# Patient Record
Sex: Male | Born: 1950 | Race: White | Hispanic: No | Marital: Married | Smoking: Never smoker
Health system: Southern US, Community
[De-identification: ages and names within clinical notes are randomized; demographics above are authoritative.]

## PROBLEM LIST (undated history)

## (undated) DIAGNOSIS — B182 Chronic viral hepatitis C: Secondary | ICD-10-CM

## (undated) DIAGNOSIS — Z860100 Personal history of colon polyps, unspecified: Secondary | ICD-10-CM

## (undated) DIAGNOSIS — G4700 Insomnia, unspecified: Secondary | ICD-10-CM

## (undated) DIAGNOSIS — T7840XA Allergy, unspecified, initial encounter: Secondary | ICD-10-CM

## (undated) DIAGNOSIS — M1990 Unspecified osteoarthritis, unspecified site: Secondary | ICD-10-CM

## (undated) DIAGNOSIS — I10 Essential (primary) hypertension: Secondary | ICD-10-CM

## (undated) DIAGNOSIS — D1803 Hemangioma of intra-abdominal structures: Secondary | ICD-10-CM

## (undated) DIAGNOSIS — F1010 Alcohol abuse, uncomplicated: Secondary | ICD-10-CM

## (undated) DIAGNOSIS — Z5189 Encounter for other specified aftercare: Secondary | ICD-10-CM

## (undated) DIAGNOSIS — E785 Hyperlipidemia, unspecified: Secondary | ICD-10-CM

## (undated) DIAGNOSIS — B1920 Unspecified viral hepatitis C without hepatic coma: Secondary | ICD-10-CM

## (undated) DIAGNOSIS — R079 Chest pain, unspecified: Secondary | ICD-10-CM

## (undated) DIAGNOSIS — F419 Anxiety disorder, unspecified: Secondary | ICD-10-CM

## (undated) HISTORY — DX: Essential (primary) hypertension: I10

## (undated) HISTORY — DX: Chest pain, unspecified: R07.9

## (undated) HISTORY — DX: Anxiety disorder, unspecified: F41.9

## (undated) HISTORY — DX: Encounter for other specified aftercare: Z51.89

## (undated) HISTORY — DX: Allergy, unspecified, initial encounter: T78.40XA

## (undated) HISTORY — DX: Alcohol abuse, uncomplicated: F10.10

## (undated) HISTORY — PX: NASAL SINUS SURGERY: SHX719

## (undated) HISTORY — DX: Hemangioma of intra-abdominal structures: D18.03

## (undated) HISTORY — DX: Hyperlipidemia, unspecified: E78.5

## (undated) HISTORY — DX: Personal history of colonic polyps: Z86.010

## (undated) HISTORY — DX: Personal history of colon polyps, unspecified: Z86.0100

## (undated) HISTORY — DX: Unspecified viral hepatitis C without hepatic coma: B19.20

## (undated) HISTORY — DX: Unspecified osteoarthritis, unspecified site: M19.90

## (undated) HISTORY — DX: Chronic viral hepatitis C: B18.2

## (undated) HISTORY — DX: Insomnia, unspecified: G47.00

---
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
## 1983-05-30 HISTORY — PX: MANDIBLE SURGERY: SHX707

## 1997-11-24 ENCOUNTER — Ambulatory Visit (HOSPITAL_COMMUNITY): Admission: RE | Admit: 1997-11-24 | Discharge: 1997-11-24 | Payer: Self-pay | Admitting: Gastroenterology

## 1998-05-29 HISTORY — PX: OTHER SURGICAL HISTORY: SHX169

## 1998-08-14 ENCOUNTER — Ambulatory Visit (HOSPITAL_COMMUNITY): Admission: RE | Admit: 1998-08-14 | Discharge: 1998-08-14 | Payer: Self-pay | Admitting: Internal Medicine

## 2000-02-17 ENCOUNTER — Ambulatory Visit (HOSPITAL_COMMUNITY): Admission: RE | Admit: 2000-02-17 | Discharge: 2000-02-17 | Payer: Self-pay | Admitting: Gastroenterology

## 2001-05-29 HISTORY — PX: UMBILICAL HERNIA REPAIR: SHX196

## 2001-08-14 ENCOUNTER — Encounter (INDEPENDENT_AMBULATORY_CARE_PROVIDER_SITE_OTHER): Payer: Self-pay | Admitting: Specialist

## 2001-08-14 ENCOUNTER — Ambulatory Visit (HOSPITAL_COMMUNITY): Admission: RE | Admit: 2001-08-14 | Discharge: 2001-08-14 | Payer: Self-pay | Admitting: General Surgery

## 2002-02-18 ENCOUNTER — Encounter (INDEPENDENT_AMBULATORY_CARE_PROVIDER_SITE_OTHER): Payer: Self-pay | Admitting: Specialist

## 2002-02-18 ENCOUNTER — Encounter: Payer: Self-pay | Admitting: Gastroenterology

## 2002-02-18 ENCOUNTER — Ambulatory Visit (HOSPITAL_COMMUNITY): Admission: RE | Admit: 2002-02-18 | Discharge: 2002-02-18 | Payer: Self-pay | Admitting: Gastroenterology

## 2004-08-01 ENCOUNTER — Ambulatory Visit (HOSPITAL_COMMUNITY): Admission: RE | Admit: 2004-08-01 | Discharge: 2004-08-01 | Payer: Self-pay | Admitting: Gastroenterology

## 2004-08-01 ENCOUNTER — Encounter (INDEPENDENT_AMBULATORY_CARE_PROVIDER_SITE_OTHER): Payer: Self-pay | Admitting: *Deleted

## 2005-06-19 ENCOUNTER — Encounter: Payer: Self-pay | Admitting: Gastroenterology

## 2005-06-28 ENCOUNTER — Observation Stay (HOSPITAL_COMMUNITY): Admission: EM | Admit: 2005-06-28 | Discharge: 2005-06-29 | Payer: Self-pay | Admitting: Emergency Medicine

## 2007-08-30 ENCOUNTER — Encounter: Admission: RE | Admit: 2007-08-30 | Discharge: 2007-08-30 | Payer: Self-pay | Admitting: Gastroenterology

## 2007-09-22 ENCOUNTER — Encounter: Admission: RE | Admit: 2007-09-22 | Discharge: 2007-09-22 | Payer: Self-pay | Admitting: Internal Medicine

## 2007-09-22 DIAGNOSIS — D1803 Hemangioma of intra-abdominal structures: Secondary | ICD-10-CM | POA: Insufficient documentation

## 2007-10-05 DIAGNOSIS — B182 Chronic viral hepatitis C: Secondary | ICD-10-CM | POA: Insufficient documentation

## 2007-10-05 DIAGNOSIS — F1010 Alcohol abuse, uncomplicated: Secondary | ICD-10-CM

## 2007-10-05 DIAGNOSIS — F1020 Alcohol dependence, uncomplicated: Secondary | ICD-10-CM | POA: Insufficient documentation

## 2007-10-05 DIAGNOSIS — G4700 Insomnia, unspecified: Secondary | ICD-10-CM

## 2007-10-05 DIAGNOSIS — Z860100 Personal history of colon polyps, unspecified: Secondary | ICD-10-CM | POA: Insufficient documentation

## 2007-10-08 ENCOUNTER — Ambulatory Visit: Payer: Self-pay | Admitting: Gastroenterology

## 2007-10-08 DIAGNOSIS — F411 Generalized anxiety disorder: Secondary | ICD-10-CM | POA: Insufficient documentation

## 2007-11-18 ENCOUNTER — Telehealth (INDEPENDENT_AMBULATORY_CARE_PROVIDER_SITE_OTHER): Payer: Self-pay | Admitting: *Deleted

## 2007-12-16 ENCOUNTER — Ambulatory Visit: Payer: Self-pay | Admitting: Gastroenterology

## 2007-12-16 LAB — CONVERTED CEMR LAB
AST: 38 units/L — ABNORMAL HIGH (ref 0–37)
Albumin: 4.3 g/dL (ref 3.5–5.2)
BUN: 12 mg/dL (ref 6–23)
Calcium: 9.2 mg/dL (ref 8.4–10.5)
Chloride: 103 meq/L (ref 96–112)
Eosinophils Relative: 3.5 % (ref 0.0–5.0)
GFR calc non Af Amer: 82 mL/min
Glucose, Bld: 93 mg/dL (ref 70–99)
INR: 1 (ref 0.8–1.0)
Monocytes Relative: 12.7 % — ABNORMAL HIGH (ref 3.0–12.0)
Neutrophils Relative %: 55.6 % (ref 43.0–77.0)
Platelets: 203 10*3/uL (ref 150–400)
Potassium: 4.6 meq/L (ref 3.5–5.1)
Prothrombin Time: 12.4 s (ref 10.9–13.3)
Total Protein: 7.6 g/dL (ref 6.0–8.3)
WBC: 4.4 10*3/uL — ABNORMAL LOW (ref 4.5–10.5)
aPTT: 31.9 s — ABNORMAL HIGH (ref 21.7–29.8)

## 2007-12-17 ENCOUNTER — Telehealth: Payer: Self-pay | Admitting: Gastroenterology

## 2007-12-24 ENCOUNTER — Telehealth: Payer: Self-pay | Admitting: Gastroenterology

## 2008-04-17 ENCOUNTER — Encounter: Payer: Self-pay | Admitting: Gastroenterology

## 2008-04-28 ENCOUNTER — Encounter: Admission: RE | Admit: 2008-04-28 | Discharge: 2008-04-28 | Payer: Self-pay | Admitting: Internal Medicine

## 2008-04-28 ENCOUNTER — Encounter: Payer: Self-pay | Admitting: Gastroenterology

## 2008-07-07 ENCOUNTER — Ambulatory Visit: Payer: Self-pay | Admitting: Gastroenterology

## 2008-07-08 LAB — CONVERTED CEMR LAB
AST: 37 units/L (ref 0–37)
Alkaline Phosphatase: 70 units/L (ref 39–117)
BUN: 12 mg/dL (ref 6–23)
Basophils Relative: 0.8 % (ref 0.0–3.0)
Creatinine, Ser: 1 mg/dL (ref 0.4–1.5)
Eosinophils Absolute: 0.2 10*3/uL (ref 0.0–0.7)
Eosinophils Relative: 3.9 % (ref 0.0–5.0)
GFR calc non Af Amer: 82 mL/min
Glucose, Bld: 98 mg/dL (ref 70–99)
HCT: 48.3 % (ref 39.0–52.0)
Hemoglobin: 16.8 g/dL (ref 13.0–17.0)
MCV: 91.4 fL (ref 78.0–100.0)
Monocytes Absolute: 0.6 10*3/uL (ref 0.1–1.0)
Neutro Abs: 3.3 10*3/uL (ref 1.4–7.7)
Platelets: 217 10*3/uL (ref 150–400)
Potassium: 4.4 meq/L (ref 3.5–5.1)
Total Bilirubin: 1 mg/dL (ref 0.3–1.2)
WBC: 5.8 10*3/uL (ref 4.5–10.5)

## 2008-07-14 ENCOUNTER — Telehealth: Payer: Self-pay | Admitting: Gastroenterology

## 2008-08-10 ENCOUNTER — Telehealth (INDEPENDENT_AMBULATORY_CARE_PROVIDER_SITE_OTHER): Payer: Self-pay | Admitting: *Deleted

## 2008-08-12 ENCOUNTER — Ambulatory Visit: Payer: Self-pay | Admitting: Gastroenterology

## 2008-08-20 LAB — CONVERTED CEMR LAB
HCV Ab: REACTIVE — AB
HCV Quantitative: 2070000 intl units/mL — ABNORMAL HIGH (ref <43)

## 2008-09-02 ENCOUNTER — Telehealth (INDEPENDENT_AMBULATORY_CARE_PROVIDER_SITE_OTHER): Payer: Self-pay | Admitting: *Deleted

## 2008-09-17 ENCOUNTER — Ambulatory Visit: Payer: Self-pay | Admitting: Gastroenterology

## 2008-09-30 ENCOUNTER — Telehealth (INDEPENDENT_AMBULATORY_CARE_PROVIDER_SITE_OTHER): Payer: Self-pay | Admitting: *Deleted

## 2008-10-08 ENCOUNTER — Encounter: Payer: Self-pay | Admitting: Gastroenterology

## 2008-10-08 ENCOUNTER — Ambulatory Visit: Payer: Self-pay | Admitting: Gastroenterology

## 2008-10-12 ENCOUNTER — Ambulatory Visit: Payer: Self-pay | Admitting: Gastroenterology

## 2008-10-27 ENCOUNTER — Encounter (INDEPENDENT_AMBULATORY_CARE_PROVIDER_SITE_OTHER): Payer: Self-pay | Admitting: Diagnostic Radiology

## 2008-10-27 ENCOUNTER — Ambulatory Visit (HOSPITAL_COMMUNITY): Admission: RE | Admit: 2008-10-27 | Discharge: 2008-10-27 | Payer: Self-pay | Admitting: Gastroenterology

## 2008-10-30 ENCOUNTER — Encounter: Payer: Self-pay | Admitting: Gastroenterology

## 2008-10-30 ENCOUNTER — Ambulatory Visit: Payer: Self-pay | Admitting: Gastroenterology

## 2008-11-04 ENCOUNTER — Encounter: Payer: Self-pay | Admitting: Gastroenterology

## 2008-12-03 ENCOUNTER — Encounter: Payer: Self-pay | Admitting: Gastroenterology

## 2008-12-03 ENCOUNTER — Ambulatory Visit: Payer: Self-pay | Admitting: Gastroenterology

## 2009-11-26 ENCOUNTER — Ambulatory Visit: Payer: Self-pay | Admitting: Gastroenterology

## 2009-11-30 LAB — CONVERTED CEMR LAB
ALT: 46 units/L (ref 0–53)
AST: 37 units/L (ref 0–37)
Basophils Relative: 0.5 % (ref 0.0–3.0)
CO2: 31 meq/L (ref 19–32)
Calcium: 9.4 mg/dL (ref 8.4–10.5)
Chloride: 103 meq/L (ref 96–112)
Creatinine, Ser: 1.1 mg/dL (ref 0.4–1.5)
Eosinophils Relative: 3.8 % (ref 0.0–5.0)
GFR calc non Af Amer: 70.59 mL/min (ref >60)
INR: 1 (ref 0.8–1.0)
Lymphocytes Relative: 25.1 % (ref 12.0–46.0)
Monocytes Absolute: 0.6 10*3/uL (ref 0.1–1.0)
Neutrophils Relative %: 60.2 % (ref 43.0–77.0)
Platelets: 223 10*3/uL (ref 150.0–400.0)
Prothrombin Time: 10.8 s (ref 9.7–11.8)
RBC: 5.27 M/uL (ref 4.22–5.81)
Sodium: 142 meq/L (ref 135–145)
Total Bilirubin: 0.5 mg/dL (ref 0.3–1.2)
Total Protein: 7.8 g/dL (ref 6.0–8.3)
WBC: 6.1 10*3/uL (ref 4.5–10.5)

## 2009-12-03 ENCOUNTER — Ambulatory Visit (HOSPITAL_COMMUNITY): Admission: RE | Admit: 2009-12-03 | Discharge: 2009-12-03 | Payer: Self-pay | Admitting: Gastroenterology

## 2010-01-10 ENCOUNTER — Encounter: Payer: Self-pay | Admitting: Family Medicine

## 2010-05-02 ENCOUNTER — Encounter: Payer: Self-pay | Admitting: Gastroenterology

## 2010-06-28 NOTE — Assessment & Plan Note (Signed)
GI problem list: 1. Chronic hepatitis C. Liver biopsy 2006 showed minimally active disease (grade 1, stage 0). No signs of cirrhosis.  MRI, April 2009 showed small lesion liver suspicious for benign hemangioma.  Evaluation at [facility] 2010 hepatitis clinic including repeat liver biopsy, imaging studies suggested that he has no sign of fibrosis in liver and they did not feel strongly about recommending hepatitis see eradication therapy. 2. Personal history of tubular adenomas (3 polyps removed by Dr. Kinnie Scales. early 2007; complicated by post polypectomy  bleeding...Dr. Kinnie Scales did not see him in hosp after the complication, so he changed care.)  Colonoscopy June 2010 by Dr. Christella Hartigan found one 7 mm polyp that was hyperplastic. Recall colonoscopy June 2015.   History of Present Illness Visit Type: Follow-up Visit Primary GI MD: Rob Bunting MD Primary Provider: Juline Patch, MD Chief Complaint: yearly follow-up History of Present Illness:      very pleasant 60 year old man whom I last saw about one year ago at the time of a colonoscopy. See those results are summarized above.  Since then he has done well, feels well.  No jaundice, no overt GI bleeding, no edema.  Still drinks 2 beers on weekdays, 3 beers on weekends.  He will be seeing [facility] Hepatitis clinic soon (has tried.)           Current Medications (verified): 1)  Flonase 50 Mcg/act  Susp (Fluticasone Propionate) .... Prn 2)  Zyrtec Allergy 10 Mg  Tabs (Cetirizine Hcl) .... Once Daily 3)  Aleve 220 Mg  Caps (Naproxen Sodium) .... Prn 4)  Vitamin D 1000 Unit Tabs (Cholecalciferol) .Marland Kitchen.. 1 By Mouth Once Daily 5)  Metamucil 30.9 % Powd (Psyllium) .Marland KitchenMarland KitchenMarland Kitchen 17 Grams in Glass of Water Once Daily  Allergies (verified): 1)  ! Penicillin  Vital Signs:  Patient profile:   60 year old male Height:      69 inches Weight:      164 pounds BMI:     24.31 Pulse rate:   80 / minute BP sitting:   164 / 90  (left arm)  Vitals Entered By: Kevin Vega NCMA (November 26, 2009 2:09 PM)  Physical Exam  Additional Exam:  Constitutional: generally well appearing Psychiatric: alert and oriented times 3 Abdomen: soft, non-tender, non-distended, normal bowel sounds; no obvious ascites No lower extremity edema    Impression & Recommendations:  Problem # 1:  chronic hepatitis C he has no physical or history signs of cirrhosis. He will get a basic set of repeat labs including a CBC, complete metabolic profile, coags as well as ultrasound. He does still drink alcohol and I recommended again that he quit however he has her this recommendation countless times and has not yet stopped.  We will work to get him back in to the hepatitis medical specialties clinic at [facility]. They wanted to see him back at one year from the last visit which was July, 2010.  Other Orders: Ultrasound Abdomen (UAS) TLB-CBC Platelet - w/Differential (85025-CBCD) TLB-PT (Protime) (85610-PTP) TLB-CMP (Comprehensive Metabolic Pnl) (80053-COMP)  Patient Instructions: 1)  You will get lab test(s) done today (cbc, cmet, inr). 2)  You will be scheduled for an ultrasound. 3)  We will work to get you back in for follow up at Hep C, specialties clinic. 4)  A copy of this information will be sent to Dr. Ricki Miller. 5)  The medication list was reviewed and reconciled.  All changed / newly prescribed medications were explained.  A complete medication  list was provided to the patient / caregiver.  Appended Document: Orders Update    Clinical Lists Changes  Orders: Added new Test order of [facility]) - Signed

## 2010-09-05 LAB — CBC
HCT: 45.8 % (ref 39.0–52.0)
Hemoglobin: 16.1 g/dL (ref 13.0–17.0)
MCHC: 35.2 g/dL (ref 30.0–36.0)
Platelets: 187 10*3/uL (ref 150–400)
RDW: 12.6 % (ref 11.5–15.5)

## 2010-10-14 NOTE — Discharge Summary (Signed)
NAME:  Kevin Vega, Kevin Vega NO.:  [id number]   MEDICAL RECORD NO.:  [id number]          PATIENT TYPE:  INP   LOCATION:  5708                         FACILITY:  [facility]   PHYSICIAN:  Elliot Cousin, M.D.    DATE OF BIRTH:  January 01, 1951   DATE OF ADMISSION:  06/28/2005  DATE OF DISCHARGE:  06/29/2005                                 DISCHARGE SUMMARY   DISCHARGE DIAGNOSES:  1.  Hematochezia/bright red blood per rectum.  2.  Status post colonoscopy and polypectomy x3 on January22,2007, per Dr.      Kinnie Scales.  3.  History of hepatitis C without cirrhosis, per the patient, last biopsy      and April2006.  4.  Status post umbilical hernia repair in the past.  5.  History of rhinitis.   MEDICATIONS:  1.  Senokot-S one tablet daily as needed.  2.  Flonase one spray in each nostril as previously ordered.  3.  Multivitamin with iron.   DISCHARGE DISPOSITION:  The patient was discharged to home in improved and  stable condition on February1,2007.   1.  The patient was advised to follow up with:      1.  Dr. Kinnie Scales in one to two weeks.      2.  With his primary care physician, Dr. Lendell Caprice, in two to four weeks.  2.  He was advised to return to the hospital if gross GI bleeding were to      reoccur.   HISTORY OF PRESENT ILLNESS:  The patient is a 60 year old man who recently  underwent a colonoscopy and polypectomy, on January22,2007, who presented to  the emergency department, on January31,2007, complaining of bright red blood  per rectum.  He had three episodes of bleeding from his rectum prior to his  presentation to the emergency department.  He had taken Aleve the day prior  for cold symptoms and a low grade temperature.  He denied any abdominal  pain, nausea, or vomiting.  He felt a little lightheaded earlier, prior to  hospital admission, however, it resolved at the time of evaluation in the  emergency department.  The patient was admitted for observation.   HOSPITAL  COURSE:  BRIGHT RED BLOOD PER RECTUM/HEMATOCHEZIA/STATUS POST  POLYPECTOMY AND NSAID USE.  The patient's hemoglobin on admission was 14.5  with a hematocrit of 41.6.  His INR was 1.1, PT 14.3, and PTT was 28.  His  hemoglobin and hematocrit were monitored every eight hours.  He was started  on with IV fluids with normal saline at 75 cc/hr.  His hemoglobin did fall  to 13.0, however, it stabilized at 13.7 prior to hospital discharge.  Prior  to hospital discharge, the patient was evaluated by Dr. Kinnie Scales.  Dr. Kinnie Scales  felt that the patient's hematochezia was secondary to status post  polypectomy and recent NSAID use. The patient was completely stable and in  no acute distress at the time of hospital discharge.  He had no further  bleeding prior to  hospital discharge.  The patient was strongly advised to discontinue all  NSAIDs for several weeks.  He was advised to treat pain or pain like  symptoms with Tylenol.  However given his history of hepatitis C and alcohol  use, the patient was advised to not take Tylenol liberally.  Further  followup by Dr. Kinnie Scales .      Elliot Cousin, M.D.  Electronically Signed     DF/MEDQ  D:  07/01/2005  T:  07/01/2005  Job:  [id number]   cc:   Janae Bridgeman. Eloise Harman., M.D.  Fax: [phone]   Griffith Citron, M.D.  Fax: [phone]

## 2010-10-14 NOTE — Procedures (Signed)
[facility]  Patient:    Kevin Vega, Kevin Vega                     MRN: [id number] Proc. Date: 02/17/00 Adm. Date:  [id number] Disc. Date: [id number] Attending:  Deneen Harts CC:         Janae Bridgeman. Eloise Harman., M.D.   Procedure Report  PROCEDURE:  Colonoscopy.  INDICATION:  A 60 year old white male undergoing colonoscopy for neoplasia surveillance.  Family history is significant for father with colon polyps. The patient is symptomatic with hematochezia.  No prior colorectal neoplasia surveillance.  DESCRIPTION OF PROCEDURE:  After reviewing the nature of the procedure with the patient, including potential risks and complications and after discussing alternative methods of diagnosis and treatment, informed consent was signed.  The patient was premedicated, receiving IV sedation totalling Versed 9 mg, fentanyl 100 mcg administered IV in divided doses prior to and during the course of the procedure.  Using an Olympus pediatric PCF-140L video endoscope, rectum was intubated after normal digital examination.  There was no evidence of perianal or intrarectal pathology.  Prostate was normal in size, symmetric, and smooth.  The scope was easily advanced around the entire length of the colon to the cecum, identified by the appendiceal orifice and the ileocecal valve. Preparation was excellent throughout.  The scope was slowly withdrawn with careful inspection of the entire colon in a retrograde manner, including retroflexed view in the rectal vault.  The terminal ileum was also inspected over its distal 5 cm, which appeared entirely normal.  The colon was decompressed, scope withdrawn.  The patient tolerated the procedure without difficulty, being maintained on Datascope monitor and low-flow oxygen throughout.  Time 1, technical 1, preparation 1, total score equal to 3.  ASSESSMENT: 1. Normal colonoscopy. 2. Normal ileoscopy. 3. No evidence of  colorectal neoplasia. 4. Hematochezia probably secondary to intermittent anorectal fissure.  RECOMMENDATION: 1. Annual Hemoccult. 2. Colonoscopy five years because of family history. 3. Rectal care p.r.n. DD:  02/17/00 TD:  02/20/00 Job: [id number]

## 2010-10-14 NOTE — H&P (Signed)
NAME:  Kevin Vega, Kevin Vega NO.:  [id number]   MEDICAL RECORD NO.:  [id number]          PATIENT TYPE:  EMS   LOCATION:  MAJO                         FACILITY:  [facility]   PHYSICIAN:  Nelma Rothman, MD   DATE OF BIRTH:  [birth year]   DATE OF ADMISSION:  06/28/2005  DATE OF DISCHARGE:                                HISTORY & PHYSICAL   PRIMARY CARE PHYSICIAN:  Dr. Lendell Caprice.   PRIMARY GI DOCTOR:  Griffith Citron, M.D.   CHIEF COMPLAINT:  Bright red blood per rectum.   HISTORY OF PRESENT ILLNESS:  The patient is a 60 year old male who is  recently status post colonoscopy and polypectomy x3 on June 19, 2005,  complaining of the onset of bright red blood per rectum and hematochezia  today x3 episodes, the last of which had only minimal blood.  He states he  took an Aleve yesterday for cold symptoms and a low-grade temperature.  He  denies any abdominal pain.  He denies any nausea and vomiting.  He was  feeling a little lightheaded earlier today, but this has now resolved.  He  is otherwise feeling well.   PAST MEDICAL HISTORY:  1.  Hepatitis C without cirrhosis per the patient, last biopsy in April      2006.  2.  Status post umbilical hernia repair.   ALLERGIES:  PENICILLIN.   MEDICATIONS:  None.   SOCIAL HISTORY:  He lives in [place], [place], with his wife and 2  daughters.  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  He denies any history  of tobacco and drinks 2 drinks of alcohol per night.   FAMILY HISTORY:  His father has Parkinson's disease.  His mother is alive  and well.   REVIEW OF SYSTEMS:  Positive for cold symptoms and congestion for the past  few days as well as a low-grade fever, but he otherwise is feeling fine.  A  10-point Review of Systems is otherwise negative.   PHYSICAL EXAMINATION:  VITAL SIGNS: Temperature 98.8, blood pressure 128/77,  pulse 81, respiratory rate 20, with oxygen saturation 100% on room air.  GENERAL:  He is  in no acute distress.  HEENT:  Mucous membranes were moist.  There was no exudate.  NECK:  Supple.  There is no jugular venous distention.  HEART: Regular rate and rhythm with no murmurs, rubs, or gallops.  LUNGS:  Clear to auscultation bilaterally.  ABDOMEN:  Soft, nontender, nondistended, with normoactive bowel sounds.  EXTREMITIES:  No edema.  NEUROLOGIC:  Exam is grossly nonfocal.   LABORATORY DATA:  White blood cell count 6.7, hemoglobin 14.5, hematocrit  41.6, platelet count 204.  INR 1.1,  PTT 28.   ASSESSMENT AND PLAN:  1.  Bright red blood per rectum and hematochezia.  His hemoglobin and      hematocrit are reasonable.  He is currently hemodynamically stable.  The      patient will be n.p.o. for tonight.  Dr. Kinnie Scales is aware and does not      plan to repeat the colonoscopy for now unless the  bleeding is persistent      or severe.  He is available for consult.  We will follow q.8 h.      hemoglobin and hematocrit while he is here.  2.  History of hepatitis C.  I have encouraged complete alcohol abstinence.  3.  Disposition. If his hemoglobin and hematocrit are stable and there is no      further bleeding, I anticipate he may be able to be discharged in the      morning with followup with Dr. Kinnie Scales as planned previously.      Nelma Rothman, MD  Electronically Signed     RAR/MEDQ  D:  06/28/2005  T:  06/28/2005  Job:  [id number]

## 2010-10-14 NOTE — Op Note (Signed)
[facility]  Patient:    Kevin Vega, Kevin Vega Visit Number: [id number] MRN: [id number]          Service Type: DSU Location: DAY Attending Physician:  Carson Myrtle Dictated by:   Sheppard Plumber Earlene Plater, M.D. Proc. Date: 08/14/01 Admit Date:  08/14/2001                             Operative Report  PREOPERATIVE DIAGNOSES: 1. Umbilical hernia, incarcerated. 2. Multiple lipomata.  PROCEDURE: 1. Repair of hernia with mesh. 2. Excision of lipomas..  SURGEON:  Timothy E. Earlene Plater, M.D.  ASSISTANT:  Mr. Gilman Buttner.  ANESTHESIA:  General.  DESCRIPTION OF PROCEDURE:  Mr. Nuon is well known to me. He is otherwise healthy. He has an incarcerated umbilical hernia and multiple lipomas. He wishes them all to be repaired and/or removed today and we have agreed. He was evaluated by anesthesia, taken to the operating room, placed supine and general LMA anesthesia provided. The skin of the periumbilical area and the lipoma of the right lower quadrant abdominal wall were shaved and prepped in on e field and draped off as a sterile field. Prior to all incisions, 0.25% Marcaine with epinephrine wass used. A short horizontal incision wa made over the umbilical hernia beneath the umbilicus. The hernia was prominent in the wound. It was dissected from the surrounding subcutaneous tissue down to the fascial edge. The fascia had to be opened in order to reduce the hernia. This was mostly preperitoneal fat but there was a peritoneal sac. This was sharply removed with the cautery, bleeding was controlled. The peritoneal opening was closed with a 3-0 Vicryl suture. The wound was dry. A round plug of mesh was placed in the defect and the fascia was closed over this with interrupted buried 0 Prolene sutures. The wound was closed in layers with 3-0 Monocryl.  The lipoma in the right lower quadrant abdominal wall was removed by incision and blunt dissection. Bleeding was  controlled and the wound was closed with 3-0 Monocryl. Two lipomatas of the left upper arm were separately prepped, draped, and removed in the same fashion by injecting local, a small incision, blunt removal, control of bleeding, and closure with 3-0 Monocryl. The more distal lipoma was 2.5 cm. The upper lipoma was 3 cm. The abdominal wall lipoma was 3.5 cm. All wounds were dressed with Steri-Strips and dry sterile dressing. He tolerated it well and was removed to the recovery room in good condition. Written and verbal instructions were given to the patient and he will be seen and followed as an outpatient. Dictated by:   Sheppard Plumber Earlene Plater, M.D. Attending Physician:  Carson Myrtle DD:  08/14/01 TD:  08/14/01 Job: [id number]

## 2010-12-28 ENCOUNTER — Encounter: Payer: Self-pay | Admitting: Gastroenterology

## 2010-12-28 ENCOUNTER — Ambulatory Visit (INDEPENDENT_AMBULATORY_CARE_PROVIDER_SITE_OTHER): Payer: 59 | Admitting: Gastroenterology

## 2010-12-28 ENCOUNTER — Other Ambulatory Visit (INDEPENDENT_AMBULATORY_CARE_PROVIDER_SITE_OTHER): Payer: 59

## 2010-12-28 DIAGNOSIS — K739 Chronic hepatitis, unspecified: Secondary | ICD-10-CM

## 2010-12-28 LAB — CBC WITH DIFFERENTIAL/PLATELET
Basophils Absolute: 0 10*3/uL (ref 0.0–0.1)
Eosinophils Absolute: 0.2 10*3/uL (ref 0.0–0.7)
HCT: 48.6 % (ref 39.0–52.0)
Lymphs Abs: 1.2 10*3/uL (ref 0.7–4.0)
MCV: 90.8 fl (ref 78.0–100.0)
Monocytes Absolute: 0.5 10*3/uL (ref 0.1–1.0)
Neutrophils Relative %: 58.9 % (ref 43.0–77.0)
Platelets: 211 10*3/uL (ref 150.0–400.0)
RDW: 13 % (ref 11.5–14.6)

## 2010-12-28 LAB — PROTIME-INR: Prothrombin Time: 11.3 s (ref 10.2–12.4)

## 2010-12-28 NOTE — Patient Instructions (Addendum)
You will have labs checked today in the basement lab.  Please head down after you check out with the front desk  (cbc, cmet, inr) You will be set up for an ultrasound for chronic hepatitis C, ? Cirrhosis.  [facility] Please arrive on 12/30/10  845 am nothing to eat or drink after midnight. A copy of this information will be made available to Dr. Ricki Miller. Return to see Dr. Christella Hartigan in 12 months, sooner if needed. Alcohol consumption is bad in combination with Hep C.

## 2010-12-28 NOTE — Progress Notes (Signed)
GI problem list:  1. Chronic hepatitis C. Liver biopsy 2006 showed minimally active disease (grade 1, stage 0). No signs of cirrhosis. MRI, April 2009 showed small lesion liver suspicious for benign hemangioma. Evaluation at [facility] 2010 hepatitis clinic including repeat liver biopsy, imaging studies suggested that he has no sign of fibrosis in liver and they did not feel strongly about recommending hepatitis see eradication therapy.   Labs in July 2011 showed normal CBC, normal coags, normal complete metabolic profile. 2. Personal history of tubular adenomas (3 polyps removed by Dr. Kinnie Scales. early 2007; complicated by post polypectomy bleeding...Dr. Kinnie Scales did not see him in hosp after the complication, so he changed care.) Colonoscopy June 2010 by Dr. Christella Hartigan found one 7 mm polyp that was hyperplastic. Recall colonoscopy June 2015.   HPI: This is a very pleasant 60 year old man with chronic hepatitis C without signs of cirrhosis to date.  Yearlong sinus troubles.  Feels well otherwise.  2 a day beers and every once in a while 3.  No jaundice, no abd pains. No edema.  No overt GI bleeding.  He has called [facility] liver clinic, never heard back from them.      Past Medical History:   Hemangioma of intra-abdominal structures                     Personal history of colonic polyps                           Insomnia, unspecified                                        Alcohol abuse, unspecified                                   Chronic hepatitis C without mention of hepatic*              Anxiety                                                      Hypertension                                                Past Surgical History:   HERNIA REPAIR                                                reports that he has never smoked. He has never used smokeless tobacco. He reports that he drinks alcohol. He reports that he does not use illicit drugs.  family history includes Colon cancer in his  father.    Current medicines and allergies were reviewed in [place] [facility]    Physical Exam: BP 142/76  Pulse 64  Ht 5\' 9"  (1.753 m)  Wt 160 lb (72.576 kg)  BMI 23.63 kg/m2 Constitutional: generally well-appearing Psychiatric: alert and  oriented x3 Abdomen: soft, nontender, nondistended, no obvious ascites, no peritoneal signs, normal bowel sounds No lower extremity edema Anicteric sclera    Assessment and plan: 60 y.o. male with chronic hepatitis C  He does not have clinical signs of cirrhosis. I warned him again about alcohol consumption and told him that even small amounts could be quite dangerous in the setting of chronic hepatitis C. He'll get a basic set of labs including CBC, complete metabolic profile, coags and he will also get an abdominal ultrasound. You return to see me in one year and sooner if needed.

## 2010-12-29 LAB — COMPREHENSIVE METABOLIC PANEL
ALT: 35 U/L (ref 0–53)
AST: 32 U/L (ref 0–37)
Alkaline Phosphatase: 69 U/L (ref 39–117)
Glucose, Bld: 100 mg/dL — ABNORMAL HIGH (ref 70–99)
Potassium: 4.8 mEq/L (ref 3.5–5.1)
Sodium: 137 mEq/L (ref 135–145)
Total Bilirubin: 0.8 mg/dL (ref 0.3–1.2)
Total Protein: 8.1 g/dL (ref 6.0–8.3)

## 2010-12-30 ENCOUNTER — Ambulatory Visit (HOSPITAL_COMMUNITY)
Admission: RE | Admit: 2010-12-30 | Discharge: 2010-12-30 | Disposition: A | Discharge status: Home / Self Care | Payer: 59 | Source: Ambulatory Visit | Attending: Gastroenterology | Admitting: Gastroenterology

## 2010-12-30 DIAGNOSIS — K739 Chronic hepatitis, unspecified: Secondary | ICD-10-CM | POA: Insufficient documentation

## 2012-01-02 ENCOUNTER — Other Ambulatory Visit (INDEPENDENT_AMBULATORY_CARE_PROVIDER_SITE_OTHER): Payer: 59

## 2012-01-02 ENCOUNTER — Encounter: Payer: Self-pay | Admitting: Gastroenterology

## 2012-01-02 ENCOUNTER — Ambulatory Visit (INDEPENDENT_AMBULATORY_CARE_PROVIDER_SITE_OTHER): Payer: 59 | Admitting: Gastroenterology

## 2012-01-02 ENCOUNTER — Ambulatory Visit (HOSPITAL_COMMUNITY)
Admission: RE | Admit: 2012-01-02 | Discharge: 2012-01-02 | Disposition: A | Discharge status: Home / Self Care | Payer: 59 | Source: Ambulatory Visit | Attending: Gastroenterology | Admitting: Gastroenterology

## 2012-01-02 VITALS — BP 112/80 | HR 77 | Ht 69.0 in | Wt 163.0 lb

## 2012-01-02 DIAGNOSIS — B1920 Unspecified viral hepatitis C without hepatic coma: Secondary | ICD-10-CM | POA: Insufficient documentation

## 2012-01-02 DIAGNOSIS — B182 Chronic viral hepatitis C: Secondary | ICD-10-CM

## 2012-01-02 LAB — CBC WITH DIFFERENTIAL/PLATELET
Basophils Relative: 0.9 % (ref 0.0–3.0)
Eosinophils Absolute: 0.2 10*3/uL (ref 0.0–0.7)
HCT: 48.3 % (ref 39.0–52.0)
Hemoglobin: 16.7 g/dL (ref 13.0–17.0)
Lymphocytes Relative: 26 % (ref 12.0–46.0)
Lymphs Abs: 1.2 10*3/uL (ref 0.7–4.0)
MCHC: 34.6 g/dL (ref 30.0–36.0)
Neutro Abs: 2.7 10*3/uL (ref 1.4–7.7)
RBC: 5.35 Mil/uL (ref 4.22–5.81)

## 2012-01-02 LAB — COMPREHENSIVE METABOLIC PANEL
AST: 37 U/L (ref 0–37)
BUN: 14 mg/dL (ref 6–23)
CO2: 28 mEq/L (ref 19–32)
Calcium: 9.1 mg/dL (ref 8.4–10.5)
Chloride: 102 mEq/L (ref 96–112)
Creatinine, Ser: 1 mg/dL (ref 0.4–1.5)
GFR: 79.79 mL/min (ref >60.00)
Total Bilirubin: 0.9 mg/dL (ref 0.3–1.2)

## 2012-01-02 LAB — PROTIME-INR: INR: 1 ratio (ref 0.8–1.0)

## 2012-01-02 NOTE — Patient Instructions (Addendum)
You will have labs checked today in the basement lab.  Please head down after you check out with the front desk  (cbc, cmet, inr). You will be set up for an ultrasound of liver for chronic hepatitis C.  [facility] TODAY at 2:15 pm Nothing to eat or drink until after the Ultrasound Return to see Dr. Christella Hartigan in 12 months.

## 2012-01-02 NOTE — Progress Notes (Signed)
Review of pertinent gastrointestinal problems: 1. Chronic hepatitis C. Liver biopsy 2006 showed minimally active disease (grade 1, stage 0). No signs of cirrhosis. MRI, April 2009 showed small lesion liver suspicious for benign hemangioma. Evaluation at [facility] 2010 hepatitis clinic including repeat liver biopsy, imaging studies suggested that he has no sign of fibrosis in liver and they did not feel strongly about recommending hepatitis see eradication therapy.  Labs in July 2011 showed normal CBC, normal coags, normal complete metabolic profile.   CBC, complete metabolic profile, coags August 2012 were all normal; abdominal ultrasound August 2012 was also normal, liver looked normal. 2. Personal history of tubular adenomas (3 polyps removed by Dr. Kinnie Scales. early 2007; complicated by post polypectomy bleeding...Dr. Kinnie Scales did not see him in hosp after the complication, so he changed care.) Colonoscopy June 2010 by Dr. Christella Hartigan found one 7 mm polyp that was hyperplastic. Recall colonoscopy June 2015. 3. Father had colon cancer   HPI: This is a  very pleasant 61 year old man whom I last saw about one year ago.  He's been feeling fine.  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 Still drinking two beers a day.  Daughter at home again with some issues.  His weight has been stable. He has had no overt GI bleeding.     Past Medical History  Diagnosis Date  . Hemangioma of intra-abdominal structures   . Personal history of colonic polyps   . Insomnia, unspecified   . Alcohol abuse, unspecified   . Chronic hepatitis C without mention of hepatic coma   . Anxiety   . Hypertension     Past Surgical History  Procedure Date  . Hernia repair     Current Outpatient Prescriptions  Medication Sig Dispense Refill  . cetirizine (ZYRTEC) 10 MG tablet Take 10 mg by mouth daily.        . cholecalciferol (VITAMIN D) 1000 UNITS tablet Take by mouth 2 (two) times  daily.       . fluticasone (FLONASE) 50 MCG/ACT nasal spray Place 2 sprays into the nose as needed.        Marland Kitchen MICARDIS 80 MG tablet 1/2 tablet by mouth once daily       . naproxen sodium (ANAPROX) 220 MG tablet Take 220 mg by mouth as needed.        . Psyllium (METAMUCIL) 30.9 % POWD Take 17 g by mouth daily.          Allergies as of 01/02/2012 - Review Complete 01/02/2012  Allergen Reaction Noted  . Penicillins  10/08/2007    Family History  Problem Relation Age of Onset  . Colon cancer Father     History   Social History  . Marital Status: Married    Spouse Name: N/A    Number of Children: 2  . Years of Education: N/A   Occupational History  . carpenter    Social History Main Topics  . Smoking status: Never Smoker   . Smokeless tobacco: Never Used  . Alcohol Use: Yes     2 beers daily  . Drug Use: No  . Sexually Active: Not on file   Other Topics Concern  . Not on file   Social History Narrative  . No narrative on file      Physical Exam: BP 112/80  Pulse 77  Ht 5\' 9"  (1.753 m)  Wt 163 lb (73.936 kg)  BMI 24.07 kg/m2  SpO2  98% Constitutional: generally well-appearing Psychiatric: alert and oriented x3 Abdomen: soft, nontender, nondistended, no obvious ascites, no peritoneal signs, normal bowel sounds     Assessment and plan: 61 y.o. male with chronic hepatitis C, mild daily alcohol consumption   he knows he should cut back on his alcohol consumption but has just never been interested in that. I have to say he certainly does not seem like an alcoholic to me he just likes his 1-2 beers a day around dinnertime.  We will get a repeat set of labs today including a CBC, complete metabolic profile, coags. Abdominal ultrasound. He'll return to see me in one year. Sooner if needed

## 2012-02-07 ENCOUNTER — Other Ambulatory Visit: Payer: Self-pay | Admitting: Dermatology

## 2012-05-10 ENCOUNTER — Telehealth: Payer: Self-pay | Admitting: Gastroenterology

## 2012-05-10 NOTE — Telephone Encounter (Signed)
bloodwork last week Hb 16.5, plt 191, LFTs all normal

## 2012-11-13 ENCOUNTER — Encounter: Payer: Self-pay | Admitting: Gastroenterology

## 2013-01-03 ENCOUNTER — Ambulatory Visit (INDEPENDENT_AMBULATORY_CARE_PROVIDER_SITE_OTHER): Payer: 59 | Admitting: Gastroenterology

## 2013-01-03 ENCOUNTER — Encounter: Payer: Self-pay | Admitting: Gastroenterology

## 2013-01-03 DIAGNOSIS — B1920 Unspecified viral hepatitis C without hepatic coma: Secondary | ICD-10-CM

## 2013-01-03 NOTE — Patient Instructions (Addendum)
Please return to see Dr. Christella Hartigan in 1 year. Call sooner with any problems, concerns. Colonoscopy June 2015 for history of polyps. Cut back on drinking a bit.                                               We are excited to introduce MyChart, a new best-in-class service that provides you online access to important information in your electronic medical record. We want to make it easier for you to view your health information - all in one secure location - when and where you need it. We expect MyChart will enhance the quality of care and service we provide.  When you register for MyChart, you can:    View your test results.    Request appointments and receive appointment reminders via email.    Request medication renewals.    View your medical history, allergies, medications and immunizations.    Communicate with your physician's office through a password-protected site.    Conveniently print information such as your medication lists.  To find out if MyChart is right for you, please talk to a member of our clinical staff today. We will gladly answer your questions about this free health and wellness tool.  If you are age 19 or older and want a member of your family to have access to your record, you must provide written consent by completing a proxy form available at our office. Please speak to our clinical staff about guidelines regarding accounts for patients younger than age 73.  As you activate your MyChart account and need any technical assistance, please call the MyChart technical support line at [phone]) or email your question to [email or web address]. If you email your question(s), please include your name, a return phone number and the best time to reach you.  If you have non-urgent health-related questions, you can send a message to our office through MyChart at [email or web address]. If you have a medical emergency, call 911.  Thank you for using  MyChart as your new health and wellness resource!   MyChart licensed from [employer],  0981-1914. Patents Pending.

## 2013-01-03 NOTE — Progress Notes (Signed)
Review of pertinent gastrointestinal problems:  1. Chronic hepatitis C. Liver biopsy 2006 showed minimally active disease (grade 1, stage 0). No signs of cirrhosis. MRI, April 2009 showed small lesion liver suspicious for benign hemangioma. Evaluation at [facility] 2010 hepatitis clinic including repeat liver biopsy, imaging studies suggested that he has no sign of fibrosis in liver and they did not feel strongly about recommending hepatitis see eradication therapy.  Labs in July 2011 showed normal CBC, normal coags, normal complete metabolic profile.  CBC, complete metabolic profile July 2014 were normal. abdominal ultrasound August 2012 was also normal, liver looked normal. Abdominal ultrasound August 2013 was normal 2. Personal history of tubular adenomas (3 polyps removed by Dr. Kinnie Scales. early 2007; complicated by post polypectomy bleeding...Dr. Kinnie Scales did not see him in hosp after the complication, so he changed care.) Colonoscopy June 2010 by Dr. Christella Hartigan found one 7 mm polyp that was hyperplastic. Recall colonoscopy June 2015.  3. Father had colon cancer  HPI: This is a   very pleasant 62 year old man whom I last saw a year ago.  He had labs checked last month and they show normal CBC, normal complete metabolic profile except his AST was 1 unit above normal. Otherwise his liver tests were completely normal.  He is doing well, he has no edema, no obvious ascites. No vomiting of blood. His bowels are normal.   Past Medical History  Diagnosis Date  . Hemangioma of intra-abdominal structures   . Personal history of colonic polyps   . Insomnia, unspecified   . Alcohol abuse, unspecified   . Chronic hepatitis C without mention of hepatic coma   . Anxiety   . Hypertension     Past Surgical History  Procedure Laterality Date  . Hernia repair      Current Outpatient Prescriptions  Medication Sig Dispense Refill  . cetirizine (ZYRTEC) 10 MG tablet Take 10 mg by mouth daily.        .  cholecalciferol (VITAMIN D) 1000 UNITS tablet Take by mouth 2 (two) times daily.       . fluticasone (FLONASE) 50 MCG/ACT nasal spray Place 2 sprays into the nose as needed.        Marland Kitchen MICARDIS 80 MG tablet 1/2 tablet by mouth once daily       . naproxen sodium (ANAPROX) 220 MG tablet Take 220 mg by mouth as needed.        . Psyllium (METAMUCIL) 30.9 % POWD Take 17 g by mouth daily.         No current facility-administered medications for this visit.    Allergies as of 01/03/2013 - Review Complete 01/03/2013  Allergen Reaction Noted  . Penicillins  10/08/2007    Family History  Problem Relation Age of Onset  . Colon cancer Father     History   Social History  . Marital Status: Married    Spouse Name: N/A    Number of Children: 2  . Years of Education: N/A   Occupational History  . carpenter    Social History Main Topics  . Smoking status: Never Smoker   . Smokeless tobacco: Never Used  . Alcohol Use: Yes     Comment: 2 beers daily  . Drug Use: No  . Sexually Active: Not on file   Other Topics Concern  . Not on file   Social History Narrative  . No narrative on file      Physical Exam: BP 122/62  Pulse 72  Ht  5\' 8"  (1.727 m)  Wt 161 lb 4 oz (73.143 kg)  BMI 24.52 kg/m2 Constitutional: generally well-appearing Psychiatric: alert and oriented x3 Abdomen: soft, nontender, nondistended, no obvious ascites, no peritoneal signs, normal bowel sounds     Assessment and plan: 62 y.o. male with chronic hepatitis C  I again recommended he cut back on drinking. He has 2-3 beers a day. I do not think he is an alcoholic he does understand that drinking even small amounts can worsen his chronic liver disease. He has no clinical signs of cirrhosis and no biochemical signs either. I am certainly going to see him again in one year.  He will be due for polyp surveillance colonoscopy next summer and we had him in our recall system already

## 2013-09-30 ENCOUNTER — Encounter: Payer: Self-pay | Admitting: Gastroenterology

## 2013-10-27 ENCOUNTER — Encounter: Payer: Self-pay | Admitting: Gastroenterology

## 2013-12-12 ENCOUNTER — Encounter: Payer: Self-pay | Admitting: Gastroenterology

## 2013-12-12 ENCOUNTER — Ambulatory Visit (AMBULATORY_SURGERY_CENTER): Payer: Self-pay | Admitting: *Deleted

## 2013-12-12 VITALS — Ht 68.5 in | Wt 162.2 lb

## 2013-12-12 DIAGNOSIS — Z86010 Personal history of colonic polyps: Secondary | ICD-10-CM

## 2013-12-12 MED ORDER — MOVIPREP 100 G PO SOLR: ORAL | Status: DC

## 2013-12-12 NOTE — Progress Notes (Signed)
No allergies to eggs or soy. No problems with anesthesia.  Pt given Emmi instructions for colonoscopy  No oxygen use  No diet drug use  

## 2013-12-26 ENCOUNTER — Ambulatory Visit (AMBULATORY_SURGERY_CENTER): Payer: 59 | Admitting: Gastroenterology

## 2013-12-26 ENCOUNTER — Telehealth: Payer: Self-pay | Admitting: Gastroenterology

## 2013-12-26 ENCOUNTER — Encounter: Payer: Self-pay | Admitting: Gastroenterology

## 2013-12-26 VITALS — BP 129/73 | HR 55 | Temp 97.8°F | Resp 16 | Ht 68.0 in | Wt 162.0 lb

## 2013-12-26 DIAGNOSIS — D126 Benign neoplasm of colon, unspecified: Secondary | ICD-10-CM

## 2013-12-26 DIAGNOSIS — K5730 Diverticulosis of large intestine without perforation or abscess without bleeding: Secondary | ICD-10-CM

## 2013-12-26 DIAGNOSIS — Z86010 Personal history of colonic polyps: Secondary | ICD-10-CM

## 2013-12-26 MED ORDER — SODIUM CHLORIDE 0.9 % IV SOLN: 500.0000 mL | INTRAVENOUS | Status: DC

## 2013-12-26 NOTE — Op Note (Signed)
[facility]  [address] [place], [address]   COLONOSCOPY PROCEDURE REPORT  PATIENT: Kevin Vega, Kevin Vega  MR#: [id number] BIRTHDATE: June 30, 1950 , 63  yrs. old GENDER: Male ENDOSCOPIST: Milus Banister, MD PROCEDURE DATE:  12/26/2013 PROCEDURE:   Colonoscopy with snare polypectomy First Screening Colonoscopy - Avg.  risk and is 50 yrs.  old or older - No.  Prior Negative Screening - Now for repeat screening. N/A  History of Adenoma - Now for follow-up colonoscopy & has been > or = to 3 yrs.  Yes hx of adenoma.  Has been 3 or more years since last colonoscopy.  Polyps Removed Today? Yes. ASA CLASS:   Class II INDICATIONS:3 adenomatous polyps removed colonoscopy Dr.  Earlean Shawl 2007, no adenomatous polyps colonsocopy Dr.  Ardis Hughs 2010. MEDICATIONS: MAC sedation, administered by CRNA and Propofol (Diprivan) 230 mg IV  DESCRIPTION OF PROCEDURE:   After the risks benefits and alternatives of the procedure were thoroughly explained, informed consent was obtained.  A digital rectal exam revealed no abnormalities of the rectum.   The LB [id number]  endoscope was introduced through the anus and advanced to the cecum, which was identified by both the appendix and ileocecal valve. No adverse events experienced.   The quality of the prep was excellent.  The instrument was then slowly withdrawn as the colon was fully examined.   COLON FINDINGS: Two polyps were found, removed and sent to pathology.  These were both sessile, located in descending and transverse segments, 3-73mm across, removed with cold snare.  There were a few diverticulum in the left colon.  The examination was otherwise normal.  Retroflexed views revealed no abnormalities. The time to cecum=3 minutes 09 seconds.  Withdrawal time=8 minutes 21 seconds.  The scope was withdrawn and the procedure completed. COMPLICATIONS: There were no complications.  ENDOSCOPIC IMPRESSION: Two polyps were found, removed  and sent to pathology.There were a few diverticulum in the left colon.  The examination was otherwise normal.  RECOMMENDATIONS: If the polyp(s) removed today are proven to be adenomatous (pre-cancerous) polyps, you will need a repeat colonoscopy in 5 years.  You will receive a letter within 1-2 weeks with the results of your biopsy as well as final recommendations.  Please call my office if you have not received a letter after 3 weeks.   eSigned:  Milus Banister, MD 12/26/2013 8:12 AM   cc: Tommy Medal, MD

## 2013-12-26 NOTE — Progress Notes (Signed)
Procedure ends, to recovery, report given and VSS. 

## 2013-12-26 NOTE — Progress Notes (Signed)
Called to room to assist during endoscopic procedure.  Patient ID and intended procedure confirmed with present staff. Received instructions for my participation in the procedure from the performing physician.  

## 2013-12-26 NOTE — Patient Instructions (Signed)
YOU HAD AN ENDOSCOPIC PROCEDURE TODAY AT THE  ENDOSCOPY CENTER: Refer to the procedure report that was given to you for any specific questions about what was found during the examination.  If the procedure report does not answer your questions, please call your gastroenterologist to clarify.  If you requested that your care partner not be given the details of your procedure findings, then the procedure report has been included in a sealed envelope for you to review at your convenience later.  YOU SHOULD EXPECT: Some feelings of bloating in the abdomen. Passage of more gas than usual.  Walking can help get rid of the air that was put into your GI tract during the procedure and reduce the bloating. If you had a lower endoscopy (such as a colonoscopy or flexible sigmoidoscopy) you may notice spotting of blood in your stool or on the toilet paper. If you underwent a bowel prep for your procedure, then you may not have a normal bowel movement for a few days.  DIET: Your first meal following the procedure should be a light meal and then it is ok to progress to your normal diet.  A half-sandwich or bowl of soup is an example of a good first meal.  Heavy or fried foods are harder to digest and may make you feel nauseous or bloated.  Likewise meals heavy in dairy and vegetables can cause extra gas to form and this can also increase the bloating.  Drink plenty of fluids but you should avoid alcoholic beverages for 24 hours.  ACTIVITY: Your care partner should take you home directly after the procedure.  You should plan to take it easy, moving slowly for the rest of the day.  You can resume normal activity the day after the procedure however you should NOT DRIVE or use heavy machinery for 24 hours (because of the sedation medicines used during the test).    SYMPTOMS TO REPORT IMMEDIATELY: A gastroenterologist can be reached at any hour.  During normal business hours, 8:30 AM to 5:00 PM Monday through Friday,  call [phone].  After hours and on weekends, please call the GI answering service at [phone] who will take a message and have the physician on call contact you.   Following lower endoscopy (colonoscopy or flexible sigmoidoscopy):  Excessive amounts of blood in the stool  Significant tenderness or worsening of abdominal pains  Swelling of the abdomen that is new, acute  Fever of 100F or higher  FOLLOW UP: If any biopsies were taken you will be contacted by phone or by letter within the next 1-3 weeks.  Call your gastroenterologist if you have not heard about the biopsies in 3 weeks.  Our staff will call the home number listed on your records the next business day following your procedure to check on you and address any questions or concerns that you may have at that time regarding the information given to you following your procedure. This is a courtesy call and so if there is no answer at the home number and we have not heard from you through the emergency physician on call, we will assume that you have returned to your regular daily activities without incident.  SIGNATURES/CONFIDENTIALITY: You and/or your care partner have signed paperwork which will be entered into your electronic medical record.  These signatures attest to the fact that that the information above on your After Visit Summary has been reviewed and is understood.  Full responsibility of the confidentiality of this   this discharge information lies with you and/or your care-partner.  Polyp, diverticulosis and high fiber diet information given. 

## 2013-12-26 NOTE — Telephone Encounter (Signed)
Labs 12/04/2013: cbc normal, cmet normal except ALT 46

## 2013-12-29 ENCOUNTER — Telehealth: Payer: Self-pay | Admitting: *Deleted

## 2013-12-29 NOTE — Telephone Encounter (Signed)
  Follow up Call-  Call back number 12/26/2013  Post procedure Call Back phone  # [phone]  Permission to leave phone message Yes     Patient questions:  Do you have a fever, pain , or abdominal swelling? No. Pain Score  0 *  Have you tolerated food without any problems? Yes.    Have you been able to return to your normal activities? Yes.    Do you have any questions about your discharge instructions: Diet   No. Medications  No. Follow up visit  No.  Do you have questions or concerns about your Care? No.  Actions: * If pain score is 4 or above: No action needed, pain <4.  "you guys are all great."

## 2013-12-31 ENCOUNTER — Encounter: Payer: Self-pay | Admitting: Gastroenterology

## 2014-02-11 ENCOUNTER — Ambulatory Visit (INDEPENDENT_AMBULATORY_CARE_PROVIDER_SITE_OTHER): Payer: 59 | Admitting: Gastroenterology

## 2014-02-11 ENCOUNTER — Encounter: Payer: Self-pay | Admitting: Gastroenterology

## 2014-02-11 VITALS — BP 130/86 | HR 74 | Ht 69.0 in | Wt 161.1 lb

## 2014-02-11 DIAGNOSIS — B182 Chronic viral hepatitis C: Secondary | ICD-10-CM

## 2014-02-11 NOTE — Patient Instructions (Addendum)
Labs in 3 months (LFTs) around the week of 05/13/14. Orders are already in in our computer system so you may come to the lab between 8:30am-5:30pm on a day that week. You will be set up for an ultrasound for chronic hepatitis C. You have been scheduled for an abdominal ultrasound at [facility] (1st floor of hospital) on 02/16/14 at 8:00am. Please arrive 15 minutes prior to your appointment for registration. Make certain not to have anything to eat or drink 6 hours prior to your appointment. Should you need to reschedule your appointment, please contact radiology at [phone]. This test typically takes about 30 minutes to perform.  Please return to see Dr. Ardis Hughs in 12 months, sooner if needed.

## 2014-02-11 NOTE — Progress Notes (Signed)
Review of pertinent gastrointestinal problems:  1. Chronic hepatitis C. Liver biopsy 2006 showed minimally active disease (grade 1, stage 0). No signs of cirrhosis. MRI, April 2009 showed small lesion liver suspicious for benign hemangioma. Evaluation at [facility] 2010 hepatitis clinic including repeat liver biopsy, imaging studies suggested that he has no sign of fibrosis in liver and they did not feel strongly about recommending hepatitis see eradication therapy.  Labs in July 2011 showed normal CBC, normal coags, normal complete metabolic profile.  CBC, complete metabolic profile July 0938 were normal.  abdominal ultrasound August 2012 was also normal, liver looked normal. Abdominal ultrasound August 2013 was normal 2. Personal history of tubular adenomas (3 polyps removed by Dr. Earlean Shawl. early 1829; complicated by post polypectomy bleeding...Dr. Earlean Shawl did not see him in hosp after the complication, so he changed care.) Colonoscopy June 2010 by Dr. Ardis Hughs found one 7 mm polyp that was hyperplastic.11/2013 Colonoscopy Dr. Ardis Hughs: two subcentimeter adenomas removed, recall recommended in 5 years. 3. Father had colon cancer   HPI: This is a  very pleasant 63 year old man whom I last saw the time of a surveillance colonoscopy about 5 or 6 weeks ago.  He is overall doing very well. He is retired the past year or 2. His primary care physician drew a set of labs on him 6 weeks ago. CBC and complete metabolic profile were normal except for an ALT of 48 which was 2 above the normal range for that assay.  Doing well.  No physcial problems.  NO vomiting blood.  SAw Dr. Minna Antis a month ago  Past Medical History  Diagnosis Date  . Hemangioma of intra-abdominal structures   . Personal history of colonic polyps   . Insomnia, unspecified   . Alcohol abuse, unspecified   . Chronic hepatitis C without mention of hepatic coma   . Anxiety   . Hypertension   . Allergy   . Blood transfusion without reported  diagnosis 1959    as child  . Arthritis     Past Surgical History  Procedure Laterality Date  . Bilateral inguinal hernia  2000  . Umbilical hernia repair  2003  . Mandible surgery  1985    Current Outpatient Prescriptions  Medication Sig Dispense Refill  . cholecalciferol (VITAMIN D) 1000 UNITS tablet Take 2,000 Units by mouth daily.       Marland Kitchen Fexofenadine HCl (ALLEGRA PO) Take by mouth daily.      . fluticasone (FLONASE) 50 MCG/ACT nasal spray Place 2 sprays into the nose as needed.        Marland Kitchen MICARDIS 80 MG tablet 1/2 tablet by mouth once daily       . naproxen sodium (ANAPROX) 220 MG tablet Take 220 mg by mouth as needed.        . Psyllium (METAMUCIL) 30.9 % POWD Take 17 g by mouth daily.         No current facility-administered medications for this visit.    Allergies as of 02/11/2014 - Review Complete 02/11/2014  Allergen Reaction Noted  . Codeine  12/12/2013  . Penicillins  10/08/2007    Family History  Problem Relation Age of Onset  . Colon cancer Father 1    History   Social History  . Marital Status: Married    Spouse Name: N/A    Number of Children: 2  . Years of Education: N/A   Occupational History  . carpenter    Social History Main Topics  . Smoking status:  Never Smoker   . Smokeless tobacco: Never Used  . Alcohol Use: Yes     Comment: 2 beers daily  . Drug Use: No  . Sexual Activity: Not on file   Other Topics Concern  . Not on file   Social History Narrative  . No narrative on file      Physical Exam: BP 130/86  Pulse 74  Ht 5\' 9"  (1.753 m)  Wt 161 lb 2 oz (73.086 kg)  BMI 23.78 kg/m2  SpO2 98% Constitutional: generally well-appearing Psychiatric: alert and oriented x3 Abdomen: soft, nontender, nondistended, no obvious ascites, no peritoneal signs, normal bowel sounds     Assessment and plan: 63 y.o. male with chronic hepatitis C  He feels very well, has no clinical signs of cirrhosis. His last liver ultrasound was about 2  years ago I will repeat that now. One of his transaminases was very slightly above normal several weeks ago and we will repeat that in 3 months. He does drink 3 beers per day he knows he should abstain completely.

## 2014-02-16 ENCOUNTER — Ambulatory Visit (HOSPITAL_COMMUNITY)
Admission: RE | Admit: 2014-02-16 | Discharge: 2014-02-16 | Disposition: A | Discharge status: Home / Self Care | Payer: 59 | Source: Ambulatory Visit | Attending: Gastroenterology | Admitting: Gastroenterology

## 2014-02-16 DIAGNOSIS — B182 Chronic viral hepatitis C: Secondary | ICD-10-CM | POA: Insufficient documentation

## 2014-05-04 ENCOUNTER — Telehealth: Payer: Self-pay

## 2014-05-04 NOTE — Telephone Encounter (Signed)
-----   Message from Marzella Schlein, [place] sent at 02/11/2014  3:43 PM EDT ----- Remind patient that he needs repeat LFT's on the week of 05/13/14. See office note from 02/11/14.

## 2014-05-05 NOTE — Telephone Encounter (Signed)
The patient has been notified of this information and all questions answered.

## 2014-05-06 ENCOUNTER — Other Ambulatory Visit (INDEPENDENT_AMBULATORY_CARE_PROVIDER_SITE_OTHER): Payer: 59

## 2014-05-06 DIAGNOSIS — B182 Chronic viral hepatitis C: Secondary | ICD-10-CM

## 2014-05-06 LAB — HEPATIC FUNCTION PANEL
ALK PHOS: 71 U/L (ref 39–117)
ALT: 39 U/L (ref 0–53)
AST: 38 U/L — ABNORMAL HIGH (ref 0–37)
Albumin: 4.2 g/dL (ref 3.5–5.2)
BILIRUBIN TOTAL: 0.7 mg/dL (ref 0.2–1.2)
Bilirubin, Direct: 0.1 mg/dL (ref 0.0–0.3)
Total Protein: 7.2 g/dL (ref 6.0–8.3)

## 2014-06-24 ENCOUNTER — Telehealth: Payer: Self-pay | Admitting: Internal Medicine

## 2014-06-24 NOTE — Telephone Encounter (Signed)
Dr Ardis Hughs the pt has requested to speak to you directly.  Do you want to call him or have me set up an appt to discuss?

## 2014-08-25 ENCOUNTER — Other Ambulatory Visit (HOSPITAL_COMMUNITY): Payer: Self-pay | Admitting: Nurse Practitioner

## 2014-08-25 DIAGNOSIS — B182 Chronic viral hepatitis C: Secondary | ICD-10-CM

## 2014-09-10 ENCOUNTER — Ambulatory Visit (HOSPITAL_COMMUNITY)
Admission: RE | Admit: 2014-09-10 | Discharge: 2014-09-10 | Disposition: A | Discharge status: Home / Self Care | Payer: 59 | Source: Ambulatory Visit | Attending: Nurse Practitioner | Admitting: Nurse Practitioner

## 2014-09-10 DIAGNOSIS — B182 Chronic viral hepatitis C: Secondary | ICD-10-CM | POA: Diagnosis present

## 2014-09-10 DIAGNOSIS — K7211 Chronic hepatic failure with coma: Secondary | ICD-10-CM | POA: Insufficient documentation

## 2015-03-22 ENCOUNTER — Encounter: Payer: Self-pay | Admitting: Gastroenterology

## 2015-07-12 ENCOUNTER — Other Ambulatory Visit (HOSPITAL_COMMUNITY): Payer: Self-pay | Admitting: Physician Assistant

## 2015-07-12 DIAGNOSIS — B182 Chronic viral hepatitis C: Secondary | ICD-10-CM

## 2015-07-21 ENCOUNTER — Ambulatory Visit (HOSPITAL_COMMUNITY)
Admission: RE | Admit: 2015-07-21 | Discharge: 2015-07-21 | Disposition: A | Discharge status: Home / Self Care | Payer: 59 | Source: Ambulatory Visit | Attending: Physician Assistant | Admitting: Physician Assistant

## 2015-07-21 DIAGNOSIS — R932 Abnormal findings on diagnostic imaging of liver and biliary tract: Secondary | ICD-10-CM | POA: Insufficient documentation

## 2015-07-21 DIAGNOSIS — B182 Chronic viral hepatitis C: Secondary | ICD-10-CM | POA: Insufficient documentation

## 2016-03-21 ENCOUNTER — Ambulatory Visit (INDEPENDENT_AMBULATORY_CARE_PROVIDER_SITE_OTHER): Payer: Medicare Other | Admitting: Gastroenterology

## 2016-03-21 ENCOUNTER — Encounter (INDEPENDENT_AMBULATORY_CARE_PROVIDER_SITE_OTHER): Payer: Self-pay

## 2016-03-21 ENCOUNTER — Encounter: Payer: Self-pay | Admitting: Gastroenterology

## 2016-03-21 VITALS — BP 134/70 | HR 59 | Ht 69.0 in | Wt 169.0 lb

## 2016-03-21 DIAGNOSIS — Z800 Family history of malignant neoplasm of digestive organs: Secondary | ICD-10-CM

## 2016-03-21 DIAGNOSIS — B182 Chronic viral hepatitis C: Secondary | ICD-10-CM

## 2016-03-21 NOTE — Patient Instructions (Signed)
Recall colonoscopy 11/2018 for FH colon cancer, personal history of TAs. No further follow up for hepatitis C is needed.

## 2016-03-21 NOTE — Progress Notes (Signed)
Review of pertinent gastrointestinal problems:  1. Chronic hepatitis C. Liver biopsy 2006 showed minimally active disease (grade 1, stage 0). No signs of cirrhosis. MRI, April 2009 showed small lesion liver suspicious for benign hemangioma. Evaluation at [facility] 2010 hepatitis clinic including repeat liver biopsy, imaging studies suggested that he has no sign of fibrosis in liver and they did not feel strongly about recommending hepatitis C eradication therapy.   Labs in July 2011 showed normal CBC, normal coags, normal complete metabolic profile.   CBC, complete metabolic profile July 123456 were normal.   abdominal ultrasound August 2012 was also normal, liver looked normal. Abdominal ultrasound August 2013 was normal; [place] 06/2015 Fibrosis score Fo/F1  [facility] [place] treatment, 2016 with complete cure per patient 2. Personal history of tubular adenomas (3 polyps removed by Dr. Earlean Shawl. early [id number]; complicated by post polypectomy bleeding...Dr. Earlean Shawl did not see him in hosp after the complication, so he changed care.) Colonoscopy June 2010 by Dr. Ardis Hughs found one 7 mm polyp that was hyperplastic.11/2013 Colonoscopy Dr. Ardis Hughs: two subcentimeter adenomas removed, recall recommended in 5 years. 3. Father had colon cancer  HPI: This is a    very pleasant 65 year old man whom I last saw about 2 years ago.  Chief complaint is hepatitis C, family history colon cancer  Since his last visit here he was referred to [facility] liver clinic and underwent Harvoni treatment with complete eradication of his hepatitis C virus. Says this was an 8 week course, he felt no ill effects of the treatment. He is planning to see him again at one year follow-up in 3-4 months from now.  ROS: complete GI ROS as described in HPI.  Constitutional:  No unintentional weight loss   Past Medical History:  Diagnosis Date  . Alcohol abuse, unspecified   . Allergy   . Anxiety   . Arthritis   . Blood  transfusion without reported diagnosis 1959   as child  . Chronic hepatitis C without mention of hepatic coma   . Hemangioma of intra-abdominal structures   . Hypertension   . Insomnia, unspecified   . Personal history of colonic polyps     Past Surgical History:  Procedure Laterality Date  . bilateral inguinal hernia  2000  . MANDIBLE SURGERY  1985  . UMBILICAL HERNIA REPAIR  2003    Current Outpatient Prescriptions  Medication Sig Dispense Refill  . Cetirizine HCl (ZYRTEC ALLERGY) 10 MG CAPS Take by mouth.    . cholecalciferol (VITAMIN D) 1000 UNITS tablet Take 2,000 Units by mouth daily.     Marland Kitchen Fexofenadine HCl (ALLEGRA PO) Take by mouth daily.    . fluticasone (FLONASE) 50 MCG/ACT nasal spray Place 2 sprays into the nose as needed.      Marland Kitchen MICARDIS 80 MG tablet 1/2 tablet by mouth once daily     . naproxen sodium (ANAPROX) 220 MG tablet Take 220 mg by mouth as needed.       No current facility-administered medications for this visit.     Allergies as of 03/21/2016 - Review Complete 03/21/2016  Allergen Reaction Noted  . Codeine  12/12/2013  . Penicillins  10/08/2007    Family History  Problem Relation Age of Onset  . Colon cancer Father 38    Social History   Social History  . Marital status: Married    Spouse name: N/A  . Number of children: 2  . Years of education: N/A   Occupational History  .  carpenter    Social History Main Topics  . Smoking status: Never Smoker  . Smokeless tobacco: Never Used  . Alcohol use Yes     Comment: 2 beers daily  . Drug use: No  . Sexual activity: Not on file   Other Topics Concern  . Not on file   Social History Narrative  . No narrative on file     Physical Exam: Ht 5\' 9"  (1.753 m)   Wt 169 lb (76.7 kg)   BMI 24.96 kg/m  Constitutional: generally well-appearing Psychiatric: alert and oriented x3 Abdomen: soft, nontender, nondistended, no obvious ascites, no peritoneal signs, normal bowel sounds No  peripheral edema noted in lower extremities  Assessment and plan: 65 y.o. male with Hepatitis C, eradicated; family history colon cancer, personal history of adenomatous polyps  He does not need further follow-up for his hepatitis C. He did not have cirrhosis and he has had the virus eradicated with Harvoni treatment 8 weeks. He is due for screening, surveillance colonoscopy at 5 year interval in July 2020 for  family history of colon cancer and personal history of adenomatous polyps.   Owens Loffler, MD [facility] 03/21/2016, 8:42 AM

## 2017-12-25 DIAGNOSIS — R079 Chest pain, unspecified: Secondary | ICD-10-CM | POA: Insufficient documentation

## 2017-12-25 DIAGNOSIS — I10 Essential (primary) hypertension: Secondary | ICD-10-CM | POA: Insufficient documentation

## 2017-12-26 ENCOUNTER — Encounter: Payer: Self-pay | Admitting: Cardiovascular Disease

## 2017-12-26 ENCOUNTER — Ambulatory Visit (INDEPENDENT_AMBULATORY_CARE_PROVIDER_SITE_OTHER): Payer: Medicare Other | Admitting: Cardiovascular Disease

## 2017-12-26 ENCOUNTER — Other Ambulatory Visit: Payer: Self-pay

## 2017-12-26 VITALS — BP 153/88 | HR 62 | Ht 69.0 in | Wt 167.2 lb

## 2017-12-26 DIAGNOSIS — I208 Other forms of angina pectoris: Secondary | ICD-10-CM | POA: Diagnosis not present

## 2017-12-26 DIAGNOSIS — E7800 Pure hypercholesterolemia, unspecified: Secondary | ICD-10-CM

## 2017-12-26 DIAGNOSIS — I10 Essential (primary) hypertension: Secondary | ICD-10-CM | POA: Diagnosis not present

## 2017-12-26 DIAGNOSIS — R0602 Shortness of breath: Secondary | ICD-10-CM | POA: Diagnosis not present

## 2017-12-26 DIAGNOSIS — E785 Hyperlipidemia, unspecified: Secondary | ICD-10-CM | POA: Insufficient documentation

## 2017-12-26 NOTE — Assessment & Plan Note (Signed)
She of essential hypertension her blood pressure measured at 153/88.  He is on Micardis.  Continue current meds at current dosing.

## 2017-12-26 NOTE — Assessment & Plan Note (Signed)
New onset chest pain in the last several months associated with shortness of breath.  Risk factors include treated hypertension and hyperlipidemia.  I am going to get an exercise Myoview stress test and 2D echocardiogram to further evaluate.

## 2017-12-26 NOTE — Assessment & Plan Note (Signed)
History of hyperlipidemia with recent lipid profile performed 06/28/2017 revealing total cholesterol 186, LDL 133 and HDL of 35.

## 2017-12-26 NOTE — Patient Instructions (Signed)
Medication Instructions:  Your physician recommends that you continue on your current medications as directed. Please refer to the Current Medication list given to you today.   Labwork: NONE  Testing/Procedures: Your physician has requested that you have en exercise stress myoview. For further information please visit [email or web address]. Please follow instruction sheet, as given.  Your physician has requested that you have an echocardiogram. Echocardiography is a painless test that uses sound waves to create images of your heart. It provides your doctor with information about the size and shape of your heart and how well your heart's chambers and valves are working. This procedure takes approximately one hour. There are no restrictions for this procedure.    Follow-Up: Your physician wants you to follow-up in: 60 MONTH RECALL. You will receive a reminder letter in the mail two months in advance. If you don't receive a letter, please call our office to schedule the follow-up appointment.   Any Other Special Instructions Will Be Listed Below (If Applicable).     If you need a refill on your cardiac medications before your next appointment, please call your pharmacy.

## 2017-12-26 NOTE — Progress Notes (Signed)
12/26/2017 Kevin Vega   02-09-1951  [id number]  Primary Physician [name], MD Primary Cardiologist: Lorretta Harp MD Renae Gloss  HPI:  DELONTAE Vega is a 67 y.o. only overweight married Caucasian male father of 2, grandfather 3 grandchildren referred by Dr. Shelia Media for evaluation of chest pain or shortness of breath.  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  Risk factors are notable for treated hypertension untreated mild hyperlipidemia.  He is never had a heart attack or stroke.  There is no family history.  Is noticed increasing dyspnea on exertion and occasional chest pain on exertion over the last several months.   Current Meds  Medication Sig  . Cetirizine HCl (ZYRTEC ALLERGY) 10 MG CAPS Take by mouth.  . cholecalciferol (VITAMIN D) 1000 UNITS tablet Take 2,000 Units by mouth daily.   . fluticasone (FLONASE) 50 MCG/ACT nasal spray Place 2 sprays into the nose as needed.    Marland Kitchen MICARDIS 80 MG tablet 1/2 tablet by mouth once daily   . naproxen sodium (ANAPROX) 220 MG tablet Take 220 mg by mouth as needed.       Allergies  Allergen Reactions  . Codeine     Causes anxiety  . Penicillins     Per pt: as child; unknown reaction    Social History   Socioeconomic History  . Marital status: Married    Spouse name: Not on file  . Number of children: 2  . Years of education: Not on file  . Highest education level: Not on file  Occupational History  . Occupation: Games developer  Social Needs  . Financial resource strain: Not on file  . Food insecurity:    Worry: Not on file    Inability: Not on file  . Transportation needs:    Medical: Not on file    Non-medical: Not on file  Tobacco Use  . Smoking status: Never Smoker  . Smokeless tobacco: Never Used  Substance and Sexual Activity  . Alcohol use: Yes    Comment: 2 beers daily  . Drug use: No  . Sexual activity: Not on file  Lifestyle   . Physical activity:    Days per week: Not on file    Minutes per session: Not on file  . Stress: Not on file  Relationships  . Social connections:    Talks on phone: Not on file    Gets together: Not on file    Attends religious service: Not on file    Active member of club or organization: Not on file    Attends meetings of clubs or organizations: Not on file    Relationship status: Not on file  . Intimate partner violence:    Fear of current or ex partner: Not on file    Emotionally abused: Not on file    Physically abused: Not on file    Forced sexual activity: Not on file  Other Topics Concern  . Not on file  Social History Narrative  . Not on file     Review of Systems: General: negative for chills, fever, night sweats or weight changes.  Cardiovascular: negative for chest pain, dyspnea on exertion, edema, orthopnea, palpitations, paroxysmal nocturnal dyspnea or shortness of breath Dermatological: negative for rash Respiratory: negative for cough or wheezing Urologic: negative for hematuria Abdominal: negative for nausea, vomiting, diarrhea, bright red blood per rectum, melena, or hematemesis  Neurologic: negative for visual changes, syncope, or dizziness All other systems reviewed and are otherwise negative except as noted above.    Blood pressure (!) 153/88, pulse 62, height 5\' 9"  (1.753 m), weight 167 lb 3.2 oz (75.8 kg).  General appearance: alert and no distress Neck: no adenopathy, no carotid bruit, no JVD, supple, symmetrical, trachea midline and thyroid not enlarged, symmetric, no tenderness/mass/nodules Lungs: clear to auscultation bilaterally Heart: regular rate and rhythm, S1, S2 normal, no murmur, click, rub or gallop Extremities: extremities normal, atraumatic, no cyanosis or edema Pulses: 2+ and symmetric Skin: Skin color, texture, turgor normal. No rashes or lesions Neurologic: Alert and oriented X 3, normal strength and tone. Normal symmetric  reflexes. Normal coordination and gait  EKG not performed today  ASSESSMENT AND PLAN:   Chest pain New onset chest pain in the last several months associated with shortness of breath.  Risk factors include treated hypertension and hyperlipidemia.  I am going to get an exercise Myoview stress test and 2D echocardiogram to further evaluate.  Hypertension She of essential hypertension her blood pressure measured at 153/88.  He is on Micardis.  Continue current meds at current dosing.  Hyperlipidemia History of hyperlipidemia with recent lipid profile performed 06/28/2017 revealing total cholesterol 186, LDL 133 and HDL of 35.      Lorretta Harp MD FACP,FACC,FAHA, [facility] 12/26/2017 11:42 AM

## 2018-01-03 ENCOUNTER — Ambulatory Visit (HOSPITAL_COMMUNITY): Payer: Medicare Other | Attending: Cardiovascular Disease

## 2018-01-03 DIAGNOSIS — E785 Hyperlipidemia, unspecified: Secondary | ICD-10-CM | POA: Diagnosis not present

## 2018-01-03 DIAGNOSIS — I119 Hypertensive heart disease without heart failure: Secondary | ICD-10-CM | POA: Diagnosis not present

## 2018-01-03 DIAGNOSIS — I208 Other forms of angina pectoris: Secondary | ICD-10-CM | POA: Diagnosis not present

## 2018-01-03 DIAGNOSIS — R0602 Shortness of breath: Secondary | ICD-10-CM | POA: Insufficient documentation

## 2018-01-03 DIAGNOSIS — I081 Rheumatic disorders of both mitral and tricuspid valves: Secondary | ICD-10-CM | POA: Insufficient documentation

## 2018-01-04 ENCOUNTER — Telehealth: Payer: Self-pay | Admitting: *Deleted

## 2018-01-04 ENCOUNTER — Telehealth (HOSPITAL_COMMUNITY): Payer: Self-pay

## 2018-01-04 NOTE — Telephone Encounter (Signed)
WIFE HAD QUESTION @ TEST

## 2018-01-04 NOTE — Telephone Encounter (Signed)
Encounter complete. 

## 2018-01-04 NOTE — Telephone Encounter (Signed)
PATIENT CALLED  WANTED RESULT OF ECHO INFORMED PATIENT RESULT ARE NOT AVAILABLE.   ALSO SHOULD DO ANY WALKING FOR EXERCISE- RN INFORMED TO NOT EXERCISE  UNTIL  MYOVIEW IS COMPLETED.  PATIENT VERBALIZED  UNDERSTANDING AND  ALSO WILL  SIGN UP FOR MYCHART.

## 2018-01-09 ENCOUNTER — Ambulatory Visit (HOSPITAL_COMMUNITY)
Admission: RE | Admit: 2018-01-09 | Discharge: 2018-01-09 | Disposition: A | Discharge status: Home / Self Care | Payer: Medicare Other | Source: Ambulatory Visit | Attending: Cardiology | Admitting: Cardiology

## 2018-01-09 DIAGNOSIS — I208 Other forms of angina pectoris: Secondary | ICD-10-CM | POA: Insufficient documentation

## 2018-01-09 DIAGNOSIS — R0602 Shortness of breath: Secondary | ICD-10-CM | POA: Insufficient documentation

## 2018-01-09 LAB — MYOCARDIAL PERFUSION IMAGING
CHL CUP MPHR: 153 {beats}/min
CHL CUP NUCLEAR SDS: 1
CHL RATE OF PERCEIVED EXERTION: 19
CSEPED: 9 min
Estimated workload: 10.2 METS
Exercise duration (sec): 6 s
LV sys vol: 44 mL
LVDIAVOL: 105 mL (ref 62–150)
Peak HR: 144 {beats}/min
Percent HR: 94 %
Rest HR: 55 {beats}/min
SRS: 1
SSS: 2
TID: 0.96

## 2018-01-09 MED ORDER — REGADENOSON 0.4 MG/5ML IV SOLN: 0.4000 mg | Freq: Once | INTRAVENOUS | Status: DC

## 2018-01-09 MED ORDER — TECHNETIUM TC 99M TETROFOSMIN IV KIT
32.9000 | PACK | Freq: Once | INTRAVENOUS | Status: AC | PRN
  Administered 2018-01-09: 32.9 via INTRAVENOUS
  Filled 2018-01-09: qty 33

## 2018-01-09 MED ORDER — TECHNETIUM TC 99M TETROFOSMIN IV KIT
10.5000 | PACK | Freq: Once | INTRAVENOUS | Status: AC | PRN
  Administered 2018-01-09: 10.5 via INTRAVENOUS
  Filled 2018-01-09: qty 11

## 2018-11-18 ENCOUNTER — Encounter: Payer: Self-pay | Admitting: Gastroenterology

## 2019-02-06 ENCOUNTER — Other Ambulatory Visit: Payer: Self-pay

## 2019-02-06 DIAGNOSIS — Z20822 Contact with and (suspected) exposure to covid-19: Secondary | ICD-10-CM

## 2019-02-07 LAB — NOVEL CORONAVIRUS, NAA: SARS-CoV-2, NAA: NOT DETECTED

## 2019-02-11 ENCOUNTER — Ambulatory Visit (INDEPENDENT_AMBULATORY_CARE_PROVIDER_SITE_OTHER): Payer: Medicare Other | Admitting: Cardiovascular Disease

## 2019-02-11 ENCOUNTER — Other Ambulatory Visit: Payer: Self-pay

## 2019-02-11 ENCOUNTER — Encounter: Payer: Self-pay | Admitting: Cardiovascular Disease

## 2019-02-11 DIAGNOSIS — I10 Essential (primary) hypertension: Secondary | ICD-10-CM | POA: Diagnosis not present

## 2019-02-11 DIAGNOSIS — E782 Mixed hyperlipidemia: Secondary | ICD-10-CM | POA: Diagnosis not present

## 2019-02-11 DIAGNOSIS — I208 Other forms of angina pectoris: Secondary | ICD-10-CM

## 2019-02-11 NOTE — Assessment & Plan Note (Signed)
Occasional atypical chest pain with negative Myoview stress test and 2D echo performed 01/03/2018.

## 2019-02-11 NOTE — Progress Notes (Signed)
02/11/2019 Kevin Vega   1950-07-19  [id number]  Primary Physician [name], MD Primary Cardiologist: Lorretta Harp MD Renae Gloss  HPI:  Kevin Vega is a 68 y.o.  only overweight married Caucasian male father of 2, grandfather 3 grandchildren referred by Dr. Shelia Media for evaluation of chest pain or shortness of breath.  I last saw him in the office 12/26/2017. 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  Risk factors are notable for treated hypertension untreated mild hyperlipidemia.  He is never had a heart attack or stroke.  There is no family history.  Is noticed increasing dyspnea on exertion and occasional chest pain on exertion over the last several months.  Since I saw him a year ago I did get a Myoview stress test on him 01/09/2018 which was nonischemic and low risk that 2D echo performed 01/03/2018 which was normal as well.  He gets occasional episodes of atypical chest pain but these are infrequent.   Current Meds  Medication Sig  . Cetirizine HCl (ZYRTEC ALLERGY) 10 MG CAPS Take by mouth.  . cholecalciferol (VITAMIN D) 1000 UNITS tablet Take 2,000 Units by mouth daily.   . Cyanocobalamin (VITAMIN B-12) 2500 MCG SUBL Place 2,500 mcg under the tongue daily.  . fluticasone (FLONASE) 50 MCG/ACT nasal spray Place 2 sprays into the nose as needed.    Marland Kitchen MICARDIS 80 MG tablet 1/2 tablet by mouth once daily   . naproxen sodium (ANAPROX) 220 MG tablet Take 220 mg by mouth as needed.    . NON FORMULARY 40 mLs at bedtime. CBD Tincture   . Saw Palmetto, Serenoa repens, (SAW PALMETTO PO) Take 1 tablet by mouth daily.     Allergies  Allergen Reactions  . Codeine     Causes anxiety  . Penicillins     Per pt: as child; unknown reaction    Social History   Socioeconomic History  . Marital status: Married    Spouse name: Not on file  . Number of children: 2  . Years of education: Not on file  .  Highest education level: Not on file  Occupational History  . Occupation: Games developer  Social Needs  . Financial resource strain: Not on file  . Food insecurity    Worry: Not on file    Inability: Not on file  . Transportation needs    Medical: Not on file    Non-medical: Not on file  Tobacco Use  . Smoking status: Never Smoker  . Smokeless tobacco: Never Used  Substance and Sexual Activity  . Alcohol use: Yes    Comment: 2 beers daily  . Drug use: No  . Sexual activity: Not on file  Lifestyle  . Physical activity    Days per week: Not on file    Minutes per session: Not on file  . Stress: Not on file  Relationships  . Social Herbalist on phone: Not on file    Gets together: Not on file    Attends religious service: Not on file    Active member of club or organization: Not on file    Attends meetings of clubs or organizations: Not on file    Relationship status: Not on file  . Intimate partner violence    Fear of current or ex partner: Not on file    Emotionally abused: Not on file  Physically abused: Not on file    Forced sexual activity: Not on file  Other Topics Concern  . Not on file  Social History Narrative  . Not on file     Review of Systems: General: negative for chills, fever, night sweats or weight changes.  Cardiovascular: negative for chest pain, dyspnea on exertion, edema, orthopnea, palpitations, paroxysmal nocturnal dyspnea or shortness of breath Dermatological: negative for rash Respiratory: negative for cough or wheezing Urologic: negative for hematuria Abdominal: negative for nausea, vomiting, diarrhea, bright red blood per rectum, melena, or hematemesis Neurologic: negative for visual changes, syncope, or dizziness All other systems reviewed and are otherwise negative except as noted above.    Blood pressure 130/70, pulse 62, temperature 98.2 F (36.8 C), height 5\' 9"  (1.753 m), weight 170 lb (77.1 kg).  General appearance: alert  and no distress Neck: no adenopathy, no carotid bruit, no JVD, supple, symmetrical, trachea midline and thyroid not enlarged, symmetric, no tenderness/mass/nodules Lungs: clear to auscultation bilaterally Heart: regular rate and rhythm, S1, S2 normal, no murmur, click, rub or gallop Extremities: extremities normal, atraumatic, no cyanosis or edema Pulses: 2+ and symmetric Skin: Skin color, texture, turgor normal. No rashes or lesions Neurologic: Alert and oriented X 3, normal strength and tone. Normal symmetric reflexes. Normal coordination and gait  EKG sinus rhythm at 62 without ST or T wave changes.  I personally reviewed this EKG.  ASSESSMENT AND PLAN:   Chest pain Occasional atypical chest pain with negative Myoview stress test and 2D echo performed 01/03/2018.  Hypertension History of essential hypertension with blood pressure measured today 130/70.  He is on Micardis and checks his blood pressure at home on a routine basis.  Hyperlipidemia History of hyperlipidemia not on statin therapy with lipid profile performed 01/14/2019 revealing total cholesterol 217, LDL 108 and HDL 34.      Lorretta Harp MD [facility], [facility] 02/11/2019 9:01 AM

## 2019-02-11 NOTE — Assessment & Plan Note (Signed)
History of hyperlipidemia not on statin therapy with lipid profile performed 01/14/2019 revealing total cholesterol 217, LDL 108 and HDL 34.

## 2019-02-11 NOTE — Patient Instructions (Signed)

## 2019-02-11 NOTE — Assessment & Plan Note (Signed)
History of essential hypertension with blood pressure measured today 130/70.  He is on Micardis and checks his blood pressure at home on a routine basis.

## 2019-07-12 ENCOUNTER — Ambulatory Visit: Payer: Medicare Other | Attending: Internal Medicine

## 2019-07-12 DIAGNOSIS — Z23 Encounter for immunization: Secondary | ICD-10-CM | POA: Insufficient documentation

## 2019-07-12 NOTE — Progress Notes (Signed)
   Covid-19 Vaccination Clinic  Name:  Kevin Vega    MRN: [id number] DOB: Jan 09, 1951  07/12/2019  [name] was observed post Covid-19 immunization for 15 minutes without incidence. He was provided with Vaccine Information Sheet and instruction to access the V-Safe system.   [name] was instructed to call 911 with any severe reactions post vaccine: Marland Kitchen Difficulty breathing  . Swelling of your face and throat  . A fast heartbeat  . A bad rash all over your body  . Dizziness and weakness    Immunizations Administered    Name Date Dose VIS Date Route   Pfizer COVID-19 Vaccine 07/12/2019  9:11 AM 0.3 mL 05/09/2019 Intramuscular   Manufacturer: [place]   Lot: [id number]   [place]: [id number]

## 2019-08-04 ENCOUNTER — Ambulatory Visit: Payer: Medicare Other | Attending: Internal Medicine

## 2019-08-04 DIAGNOSIS — Z23 Encounter for immunization: Secondary | ICD-10-CM

## 2019-08-04 NOTE — Progress Notes (Signed)
   Covid-19 Vaccination Clinic  Name:  Kevin Vega    MRN: [id number] DOB: 1950/11/19  08/04/2019  Kevin Vega was observed post Covid-19 immunization for 15 minutes without incident. He was provided with Vaccine Information Sheet and instruction to access the V-Safe system.   Kevin Vega was instructed to call 911 with any severe reactions post vaccine: Marland Kitchen Difficulty breathing  . Swelling of face and throat  . A fast heartbeat  . A bad rash all over body  . Dizziness and weakness   Immunizations Administered    Name Date Dose VIS Date Route   Pfizer COVID-19 Vaccine 08/04/2019  9:41 AM 0.3 mL 05/09/2019 Intramuscular   Manufacturer: [place]   Lot: [id number]   [place]: [id number]

## 2020-01-14 ENCOUNTER — Other Ambulatory Visit: Payer: Self-pay

## 2020-01-14 ENCOUNTER — Other Ambulatory Visit: Payer: Medicare Other

## 2020-01-14 DIAGNOSIS — Z20822 Contact with and (suspected) exposure to covid-19: Secondary | ICD-10-CM

## 2020-01-16 LAB — NOVEL CORONAVIRUS, NAA: SARS-CoV-2, NAA: NOT DETECTED

## 2020-01-16 LAB — SARS-COV-2, NAA 2 DAY TAT

## 2020-07-09 ENCOUNTER — Other Ambulatory Visit: Payer: Self-pay | Admitting: Internal Medicine

## 2020-07-09 DIAGNOSIS — I10 Essential (primary) hypertension: Secondary | ICD-10-CM

## 2020-07-13 ENCOUNTER — Telehealth: Payer: Self-pay | Admitting: Hematology and Oncology

## 2020-07-13 NOTE — Telephone Encounter (Signed)
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 Mr. Sheeler has been cld and scheduled to see Dr. Alvy Bimler on 2/23 at 1pm. Pt aware to arrive 30 minutes early.

## 2020-07-21 ENCOUNTER — Other Ambulatory Visit: Payer: Self-pay

## 2020-07-21 ENCOUNTER — Encounter: Payer: Self-pay | Admitting: Hematology and Oncology

## 2020-07-21 ENCOUNTER — Inpatient Hospital Stay: Payer: Medicare Other

## 2020-07-21 ENCOUNTER — Inpatient Hospital Stay: Payer: Medicare Other | Attending: Hematology and Oncology | Admitting: Hematology and Oncology

## 2020-07-21 DIAGNOSIS — Z86010 Personal history of colonic polyps: Secondary | ICD-10-CM

## 2020-07-21 DIAGNOSIS — Z8619 Personal history of other infectious and parasitic diseases: Secondary | ICD-10-CM | POA: Diagnosis not present

## 2020-07-21 DIAGNOSIS — F1020 Alcohol dependence, uncomplicated: Secondary | ICD-10-CM | POA: Insufficient documentation

## 2020-07-21 DIAGNOSIS — Z79899 Other long term (current) drug therapy: Secondary | ICD-10-CM | POA: Diagnosis not present

## 2020-07-21 DIAGNOSIS — I10 Essential (primary) hypertension: Secondary | ICD-10-CM | POA: Insufficient documentation

## 2020-07-21 DIAGNOSIS — D45 Polycythemia vera: Secondary | ICD-10-CM | POA: Diagnosis present

## 2020-07-21 DIAGNOSIS — E860 Dehydration: Secondary | ICD-10-CM | POA: Diagnosis not present

## 2020-07-21 DIAGNOSIS — D751 Secondary polycythemia: Secondary | ICD-10-CM

## 2020-07-21 LAB — CBC WITH DIFFERENTIAL/PLATELET
Abs Immature Granulocytes: 0.05 10*3/uL (ref 0.00–0.07)
Basophils Absolute: 0.1 10*3/uL (ref 0.0–0.1)
Basophils Relative: 1 %
Eosinophils Absolute: 0.1 10*3/uL (ref 0.0–0.5)
Eosinophils Relative: 1 %
HCT: 47.8 % (ref 39.0–52.0)
Hemoglobin: 17.3 g/dL — ABNORMAL HIGH (ref 13.0–17.0)
Immature Granulocytes: 1 %
Lymphocytes Relative: 12 %
Lymphs Abs: 0.9 10*3/uL (ref 0.7–4.0)
MCH: 30.2 pg (ref 26.0–34.0)
MCHC: 36.2 g/dL — ABNORMAL HIGH (ref 30.0–36.0)
MCV: 83.6 fL (ref 80.0–100.0)
Monocytes Absolute: 0.8 10*3/uL (ref 0.1–1.0)
Monocytes Relative: 10 %
Neutro Abs: 5.9 10*3/uL (ref 1.7–7.7)
Neutrophils Relative %: 75 %
Platelets: 204 10*3/uL (ref 150–400)
RBC: 5.72 MIL/uL (ref 4.22–5.81)
RDW: 12.3 % (ref 11.5–15.5)
WBC: 7.8 10*3/uL (ref 4.0–10.5)
nRBC: 0 % (ref 0.0–0.2)

## 2020-07-21 LAB — URIC ACID: Uric Acid, Serum: 6.9 mg/dL (ref 3.7–8.6)

## 2020-07-21 LAB — LACTATE DEHYDROGENASE: LDH: 177 U/L (ref 98–192)

## 2020-07-21 NOTE — Assessment & Plan Note (Signed)
His recent blood pressure medication dose was increased due to uncontrolled hypertension I told the patient that polycythemia vera can cause poorly controlled hypertension As above, we will proceed with work-up If he tested positive for polycythemia vera, we will start aggressive phlebotomy along with hydroxyurea

## 2020-07-21 NOTE — Progress Notes (Signed)
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 It would be highly unusual to see inheritable form of polycythemia vera but certainly a possibility We discussed the importance of aggressive work-up We discussed the risk and benefits of bone marrow aspirate biopsy versus next generation sequencing through peripheral blood For now, we will proceed with peripheral blood testing I will see him back in 2 weeks for further follow-up  I also recommend increase oral fluid hydration because slight dehydration can cause slight erythrocytosis and his chronic alcohol intake would also cause slight dehydration I do not recommend phlebotomy right now I recommend him to take aspirin   Alcoholism ([place]) He has significant alcohol intake over the last 40 years The patient had history of treated hepatitis C that would put him at risk of liver cirrhosis I recommend the patient to start tapering of his alcohol use  Essential hypertension His recent blood pressure medication dose was increased due to uncontrolled hypertension I told the patient that polycythemia vera can cause poorly controlled hypertension As above, we will proceed with work-up If he tested positive for polycythemia vera, we will start aggressive phlebotomy along with hydroxyurea  Orders Placed This Encounter  Procedures  . CBC with Differential/Platelet    Standing Status:   Future    Number of Occurrences:   1    Standing Expiration Date:   07/21/2021  . Erythropoietin    Standing Status:   Future    Number of Occurrences:   1    Standing Expiration Date:   07/21/2021  . JAK2 (including V617F and Exon 12), MPL, and CALR-Next Generation Sequencing    Standing Status:   Future    Number  of Occurrences:   1    Standing Expiration Date:   07/21/2021  . Lactate dehydrogenase    Standing Status:   Future    Number of Occurrences:   1    Standing Expiration Date:   07/21/2021  . Uric acid    Standing Status:   Future    Number of Occurrences:   1    Standing Expiration Date:   07/21/2021     The total time spent in the appointment was 60 minutes encounter with patients including review of chart and various tests results, discussions about plan of care and coordination of care plan   All questions were answered. The patient knows to call the clinic with any problems, questions or concerns. No barriers to learning was detected.  Heath Lark, MD 07/21/2020 3:22 PM  CHIEF COMPLAINTS/PURPOSE OF CONSULTATION:  Erythrocytosis  HISTORY OF PRESENTING ILLNESS:  Kevin Vega 70 y.o. male is here because of elevated hemoglobin. He is here accompanied by his wife, Kevin Vega "Kevin Vega" is retired  He was found to have abnormal CBC from his primary care physician's office test results Based on scanned information, his latest hemoglobin was around 17 According to our electronic records dated from 12/24/2007 to January 02, 2012, his hemoglobin ranged between 16.1-16.8 He has chronic headaches which he attributed to sinus congestion and frequent intermittent leg cramps and occasional chest discomfort He is undergoing aggressive evaluation including coronary calcium score with CT imaging next month His blood pressure medication was recently increased because his systolic blood pressure average around 160 with  diastolic around 90 He never suffer from diagnosis of blood clot.  There is no prior diagnosis of obstructive sleep apnea, although his wife did say that he might snore. The patient denies weight loss or skin itching. He denies smoking He denies significant history of pulmonary disease The patient has chronic alcohol use, 3 beers per day for the last 40 years He was treated for hepatitis  C which he might have contracted from blood transfusion when he was 70 years old He was discharged from GI service after multiple imaging study did not show evidence of liver cirrhosis  Interestingly, he has strong family history of polycythemia vera in his mother and his maternal uncle His mother is deceased With his permission, I reviewed her electronic records and confirmed that she has JAK2 mutation positive polycythemia vera  MEDICAL HISTORY:  Past Medical History:  Diagnosis Date  . Alcohol abuse, unspecified   . Allergy   . Anxiety   . Arthritis   . Blood transfusion without reported diagnosis 1959   as child  . Chest pain   . Chronic hepatitis C without mention of hepatic coma   . Hemangioma of intra-abdominal structures   . Hypertension   . Insomnia, unspecified   . Personal history of colonic polyps     SURGICAL HISTORY: Past Surgical History:  Procedure Laterality Date  . bilateral inguinal hernia  2000  . MANDIBLE SURGERY  1985  . NASAL SINUS SURGERY    . UMBILICAL HERNIA REPAIR  2003    SOCIAL HISTORY: Social History   Socioeconomic History  . Marital status: Married    Spouse name: Not on file  . Number of children: 2  . Years of education: Not on file  . Highest education level: Not on file  Occupational History  . Occupation: Games developer  Tobacco Use  . Smoking status: Never Smoker  . Smokeless tobacco: Never Used  Substance and Sexual Activity  . Alcohol use: Yes    Comment: 2 beers daily  . Drug use: No  . Sexual activity: Not on file  Other Topics Concern  . Not on file  Social History Narrative  . Not on file   Social Determinants of Health   Financial Resource Strain: Not on file  Food Insecurity: Not on file  Transportation Needs: Not on file  Physical Activity: Not on file  Stress: Not on file  Social Connections: Not on file  Intimate Partner Violence: Not on file    FAMILY HISTORY: Family History  Problem Relation Age of  Onset  . Colon cancer Father 10  . Parkinson's disease Father   . Stroke Mother   . Polycythemia Mother   . Cancer Maternal Grandmother   . Polycythemia Paternal Uncle        polycythemia    ALLERGIES:  is allergic to codeine and penicillins.  MEDICATIONS:  Current Outpatient Medications  Medication Sig Dispense Refill  . aspirin EC 81 MG tablet Take 81 mg by mouth daily. Swallow whole.    . telmisartan-hydrochlorothiazide (MICARDIS HCT) 80-25 MG tablet Take 1 tablet by mouth daily.    . Cetirizine HCl (ZYRTEC ALLERGY) 10 MG CAPS Take by mouth.    . cholecalciferol (VITAMIN D) 1000 UNITS tablet Take 2,000 Units by mouth daily.     . Cyanocobalamin (VITAMIN B-12) 2500 MCG SUBL Place 2,500 mcg under the tongue daily.    . fluticasone (FLONASE) 50 MCG/ACT nasal spray Place 2 sprays into the nose as needed.      Marland Kitchen  naproxen sodium (ANAPROX) 220 MG tablet Take 220 mg by mouth as needed.      . NON FORMULARY 40 mLs at bedtime. CBD Tincture      No current facility-administered medications for this visit.    REVIEW OF SYSTEMS:   Constitutional: Denies fevers, chills or abnormal night sweats Eyes: Denies blurriness of vision, double vision or watery eyes Ears, nose, mouth, throat, and face: Denies mucositis or sore throat Respiratory: Denies cough, dyspnea or wheezes Cardiovascular: Denies palpitation, chest discomfort or lower extremity swelling Gastrointestinal:  Denies nausea, heartburn or change in bowel habits Skin: Denies abnormal skin rashes Lymphatics: Denies new lymphadenopathy or easy bruising Neurological:Denies numbness, tingling or new weaknesses Behavioral/Psych: Mood is stable, no new changes  All other systems were reviewed with the patient and are negative.  PHYSICAL EXAMINATION: ECOG PERFORMANCE STATUS: 1 - Symptomatic but completely ambulatory  Vitals:   07/21/20 1248  BP: (!) 142/93  Pulse: 78  Resp: 20  Temp: 98.6 F (37 C)  SpO2: 99%   Filed Weights    07/21/20 1248  Weight: 172 lb 9.6 oz (78.3 kg)    GENERAL:alert, no distress and comfortable SKIN: skin color, texture, turgor are normal, no rashes or significant lesions.  He looks plethoric EYES: normal, conjunctiva are pink and non-injected, sclera clear OROPHARYNX:no exudate, no erythema and lips, buccal mucosa, and tongue normal  NECK: supple, thyroid normal size, non-tender, without nodularity LYMPH:  no palpable lymphadenopathy in the cervical, axillary or inguinal LUNGS: clear to auscultation and percussion with normal breathing effort HEART: regular rate & rhythm and no murmurs and no lower extremity edema ABDOMEN:abdomen soft, non-tender and normal bowel sounds.  No splenomegaly Musculoskeletal:no cyanosis of digits and no clubbing  PSYCH: alert & oriented x 3 with fluent speech NEURO: no focal motor/sensory deficits  LABORATORY DATA:  I have reviewed the data as listed Recent Results (from the past 2160 hour(s))  CBC with Differential/Platelet     Status: Abnormal   Collection Time: 07/21/20  1:49 PM  Result Value Ref Range   WBC 7.8 4.0 - 10.5 K/uL   RBC 5.72 4.22 - 5.81 MIL/uL   Hemoglobin 17.3 (H) 13.0 - 17.0 g/dL   HCT 47.8 39.0 - 52.0 %   MCV 83.6 80.0 - 100.0 fL   MCH 30.2 26.0 - 34.0 pg   MCHC 36.2 (H) 30.0 - 36.0 g/dL   RDW 12.3 11.5 - 15.5 %   Platelets 204 150 - 400 K/uL   nRBC 0.0 0.0 - 0.2 %   Neutrophils Relative % 75 %   Neutro Abs 5.9 1.7 - 7.7 K/uL   Lymphocytes Relative 12 %   Lymphs Abs 0.9 0.7 - 4.0 K/uL   Monocytes Relative 10 %   Monocytes Absolute 0.8 0.1 - 1.0 K/uL   Eosinophils Relative 1 %   Eosinophils Absolute 0.1 0.0 - 0.5 K/uL   Basophils Relative 1 %   Basophils Absolute 0.1 0.0 - 0.1 K/uL   Immature Granulocytes 1 %   Abs Immature Granulocytes 0.05 0.00 - 0.07 K/uL    Comment: Performed at [facility], [address] [place], [place] [address]  Uric acid     Status: None   Collection Time: 07/21/20   1:49 PM  Result Value Ref Range   Uric Acid, Serum 6.9 3.7 - 8.6 mg/dL    Comment: Performed at [facility], [place] [address], [place], [place] [address]  Lactate dehydrogenase  Status: None   Collection Time: 07/21/20  1:50 PM  Result Value Ref Range   LDH 177 98 - 192 U/L    Comment: Performed at [facility], [place] [address], [place], [place] [address]    RADIOGRAPHIC STUDIES: I have reviewed his prior CT imaging I have personally reviewed the radiological images as listed and agreed with the findings in the report.

## 2020-07-21 NOTE — Assessment & Plan Note (Addendum)
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 It would be highly unusual to see inheritable form of polycythemia vera but certainly a possibility We discussed the importance of aggressive work-up We discussed the risk and benefits of bone marrow aspirate biopsy versus next generation sequencing through peripheral blood For now, we will proceed with peripheral blood testing I will see him back in 2 weeks for further follow-up  I also recommend increase oral fluid hydration because slight dehydration can cause slight erythrocytosis and his chronic alcohol intake would also cause slight dehydration I do not recommend phlebotomy right now I recommend him to take aspirin

## 2020-07-21 NOTE — Assessment & Plan Note (Signed)
He has significant alcohol intake over the last 40 years The patient had history of treated hepatitis C that would put him at risk of liver cirrhosis I recommend the patient to start tapering of his alcohol use

## 2020-07-22 LAB — ERYTHROPOIETIN: Erythropoietin: 6.3 m[IU]/mL (ref 2.6–18.5)

## 2020-07-30 LAB — JAK2 (INCLUDING V617F AND EXON 12), MPL,& CALR-NEXT GEN SEQ

## 2020-08-03 ENCOUNTER — Telehealth: Payer: Self-pay | Admitting: Hematology and Oncology

## 2020-08-03 ENCOUNTER — Other Ambulatory Visit: Payer: Self-pay

## 2020-08-03 ENCOUNTER — Encounter: Payer: Self-pay | Admitting: Hematology and Oncology

## 2020-08-03 ENCOUNTER — Inpatient Hospital Stay: Payer: Medicare Other | Attending: Hematology and Oncology | Admitting: Hematology and Oncology

## 2020-08-03 VITALS — BP 148/86 | HR 90 | Temp 97.8°F | Resp 18 | Ht 69.0 in | Wt 172.2 lb

## 2020-08-03 DIAGNOSIS — I10 Essential (primary) hypertension: Secondary | ICD-10-CM

## 2020-08-03 DIAGNOSIS — D751 Secondary polycythemia: Secondary | ICD-10-CM | POA: Diagnosis present

## 2020-08-03 NOTE — Assessment & Plan Note (Signed)
His recent blood pressure medication was modified We discussed the importance of adequate fluid hydration and lifestyle modification including weight loss He appears motivated to try

## 2020-08-03 NOTE — Telephone Encounter (Signed)
Scheduled appts per 3/8 sch msg. Gave pt a print out of AVS.

## 2020-08-03 NOTE — Assessment & Plan Note (Signed)
I reviewed and gave copies of test results with the patient and his wife So far, the blood work and molecular studies are not suggestive of myeloproliferative disorder, despite strong family history of polycythemia vera If the patient desire further testing, we can proceed with bone marrow aspirate and biopsy Alternatively, it is possible that the patient might have secondary erythrocytosis related to undiagnosed obstructive sleep apnea or slight dehydration secondary to his diuretic therapy We also discussed the risk and benefits of phlebotomy The patient had unpleasant experience in the past from phlebotomy and would like to avoid that if possible After a very long discussion, he would like to try lifestyle modification with increasing oral fluid intake  I plan to repeat his blood work again in 3 months and if his hemoglobin is still elevated, the patient is in agreement to undergo phlebotomy at that point in time

## 2020-08-03 NOTE — Progress Notes (Signed)
[name] OFFICE PROGRESS NOTE  [name], MD  ASSESSMENT & PLAN:  Secondary erythrocytosis I reviewed and gave copies of test results with the patient and his wife So far, the blood work and molecular studies are not suggestive of myeloproliferative disorder, despite strong family history of polycythemia vera If the patient desire further testing, we can proceed with bone marrow aspirate and biopsy Alternatively, it is possible that the patient might have secondary erythrocytosis related to undiagnosed obstructive sleep apnea or slight dehydration secondary to his diuretic therapy We also discussed the risk and benefits of phlebotomy The patient had unpleasant experience in the past from phlebotomy and would like to avoid that if possible After a very long discussion, he would like to try lifestyle modification with increasing oral fluid intake  I plan to repeat his blood work again in 3 months and if his hemoglobin is still elevated, the patient is in agreement to undergo phlebotomy at that point in time  Essential hypertension His recent blood pressure medication was modified We discussed the importance of adequate fluid hydration and lifestyle modification including weight loss He appears motivated to try   Orders Placed This Encounter  Procedures  . Ferritin    Standing Status:   Standing    Number of Occurrences:   3    Standing Expiration Date:   08/03/2021  . CBC with Differential/Platelet    Standing Status:   Standing    Number of Occurrences:   22    Standing Expiration Date:   08/03/2021    The total time spent in the appointment was 30 minutes encounter with patients including review of chart and various tests results, discussions about plan of care and coordination of care plan   All questions were answered. The patient knows to call the clinic with any problems, questions or concerns. No barriers to learning was detected.    Heath Lark,  MD 3/8/20221:05 PM  INTERVAL HISTORY: Kevin Vega 70 y.o. male returns for further follow-up and to review test results He is accompanied by his wife We spent over 30 minutes reviewing test results and discuss plan of care He feels fine He admits that it is difficult for him to drink approximately 80 ounces of water a day  SUMMARY OF HEMATOLOGIC HISTORY: Kevin Vega 70 y.o. male is here because of elevated hemoglobin. He is here accompanied by his wife, Kevin Vega [name] is retired  He was found to have abnormal CBC from his primary care physician's office test results Based on scanned information, his latest hemoglobin was around 17 According to our electronic records dated from 12/24/2007 to January 02, 2012, his hemoglobin ranged between 16.1-16.8 He has chronic headaches which he attributed to sinus congestion and frequent intermittent leg cramps and occasional chest discomfort He is undergoing aggressive evaluation including coronary calcium score with CT imaging next month His blood pressure medication was recently increased because his systolic blood pressure average around 062 with diastolic around 90 He never suffer from diagnosis of blood clot.  There is no prior diagnosis of obstructive sleep apnea, although his wife did say that he might snore. The patient denies weight loss or skin itching. He denies smoking He denies significant history of pulmonary disease The patient has chronic alcohol use, 3 beers per day for the last 40 years He was treated for hepatitis C which he might have contracted from blood transfusion when he was 70 years old He was discharged from GI service  after multiple imaging study did not show evidence of liver cirrhosis  Interestingly, he has strong family history of polycythemia vera in his mother and his maternal uncle His mother is deceased With his permission, I reviewed her electronic records and confirmed that she has JAK2 mutation positive  polycythemia vera On 07/21/2020, I repeated blood work which show hemoglobin of 17.3.  Serum erythropoietin level was normal.  LDH was normal.  Peripheral blood for JAK2 mutation and MPD panel were within normal range  I have reviewed the past medical history, past surgical history, social history and family history with the patient and they are unchanged from previous note.  ALLERGIES:  is allergic to codeine and penicillins.  MEDICATIONS:  Current Outpatient Medications  Medication Sig Dispense Refill  . aspirin EC 81 MG tablet Take 81 mg by mouth daily. Swallow whole.    . Cetirizine HCl (ZYRTEC ALLERGY) 10 MG CAPS Take by mouth.    . cholecalciferol (VITAMIN D) 1000 UNITS tablet Take 2,000 Units by mouth daily.     . Cyanocobalamin (VITAMIN B-12) 2500 MCG SUBL Place 2,500 mcg under the tongue daily.    . fluticasone (FLONASE) 50 MCG/ACT nasal spray Place 2 sprays into the nose as needed.      . naproxen sodium (ANAPROX) 220 MG tablet Take 220 mg by mouth as needed.      . NON FORMULARY 40 mLs at bedtime. CBD Tincture     . telmisartan-hydrochlorothiazide (MICARDIS HCT) 80-25 MG tablet Take 1 tablet by mouth daily.     No current facility-administered medications for this visit.     REVIEW OF SYSTEMS:   Constitutional: Denies fevers, chills or night sweats Eyes: Denies blurriness of vision Ears, nose, mouth, throat, and face: Denies mucositis or sore throat Respiratory: Denies cough, dyspnea or wheezes Cardiovascular: Denies palpitation, chest discomfort or lower extremity swelling Gastrointestinal:  Denies nausea, heartburn or change in bowel habits Skin: Denies abnormal skin rashes Lymphatics: Denies new lymphadenopathy or easy bruising Neurological:Denies numbness, tingling or new weaknesses Behavioral/Psych: Mood is stable, no new changes  All other systems were reviewed with the patient and are negative.  PHYSICAL EXAMINATION: ECOG PERFORMANCE STATUS: 0 -  Asymptomatic  Vitals:   08/03/20 0920  BP: (!) 148/86  Pulse: 90  Resp: 18  Temp: 97.8 F (36.6 C)  SpO2: 100%   Filed Weights   08/03/20 0920  Weight: 172 lb 3.2 oz (78.1 kg)    GENERAL:alert, no distress and comfortable.  He looks plethoric NEURO: alert & oriented x 3 with fluent speech, no focal motor/sensory deficits  LABORATORY DATA:  I have reviewed the data as listed     Component Value Date/Time   NA 137 01/02/2012 0926   K 4.6 01/02/2012 0926   CL 102 01/02/2012 0926   CO2 28 01/02/2012 0926   GLUCOSE 91 01/02/2012 0926   BUN 14 01/02/2012 0926   CREATININE 1.0 01/02/2012 0926   CALCIUM 9.1 01/02/2012 0926   PROT 7.2 05/06/2014 0951   ALBUMIN 4.2 05/06/2014 0951   AST 38 (H) 05/06/2014 0951   ALT 39 05/06/2014 0951   ALKPHOS 71 05/06/2014 0951   BILITOT 0.7 05/06/2014 0951   GFRNONAA 70.59 11/26/2009 1429   GFRAA 99 07/07/2008 1503    No results found for: SPEP, UPEP  Lab Results  Component Value Date   WBC 7.8 07/21/2020   NEUTROABS 5.9 07/21/2020   HGB 17.3 (H) 07/21/2020   HCT 47.8 07/21/2020   MCV 83.6 07/21/2020  PLT 204 07/21/2020      Chemistry      Component Value Date/Time   NA 137 01/02/2012 0926   K 4.6 01/02/2012 0926   CL 102 01/02/2012 0926   CO2 28 01/02/2012 0926   BUN 14 01/02/2012 0926   CREATININE 1.0 01/02/2012 0926      Component Value Date/Time   CALCIUM 9.1 01/02/2012 0926   ALKPHOS 71 05/06/2014 0951   AST 38 (H) 05/06/2014 0951   ALT 39 05/06/2014 0951   BILITOT 0.7 05/06/2014 0951

## 2020-08-09 ENCOUNTER — Ambulatory Visit
Admission: RE | Admit: 2020-08-09 | Discharge: 2020-08-09 | Disposition: A | Discharge status: Home / Self Care | Payer: Medicare Other | Source: Ambulatory Visit | Attending: Internal Medicine | Admitting: Internal Medicine

## 2020-08-09 DIAGNOSIS — I10 Essential (primary) hypertension: Secondary | ICD-10-CM

## 2020-10-15 ENCOUNTER — Telehealth: Payer: Self-pay | Admitting: Hematology and Oncology

## 2020-10-15 NOTE — Telephone Encounter (Signed)
Sch per 3/8 los, pt aware

## 2020-11-03 ENCOUNTER — Ambulatory Visit: Payer: Medicare Other | Admitting: Hematology and Oncology

## 2020-11-03 ENCOUNTER — Other Ambulatory Visit: Payer: Medicare Other

## 2020-11-04 ENCOUNTER — Ambulatory Visit: Payer: Medicare Other | Admitting: Hematology and Oncology

## 2020-11-04 ENCOUNTER — Inpatient Hospital Stay (HOSPITAL_BASED_OUTPATIENT_CLINIC_OR_DEPARTMENT_OTHER): Payer: Medicare Other | Admitting: Hematology and Oncology

## 2020-11-04 ENCOUNTER — Encounter: Payer: Self-pay | Admitting: Hematology and Oncology

## 2020-11-04 ENCOUNTER — Other Ambulatory Visit: Payer: Medicare Other

## 2020-11-04 ENCOUNTER — Inpatient Hospital Stay: Payer: Medicare Other | Attending: Hematology and Oncology

## 2020-11-04 ENCOUNTER — Other Ambulatory Visit: Payer: Self-pay

## 2020-11-04 ENCOUNTER — Inpatient Hospital Stay: Payer: Medicare Other

## 2020-11-04 ENCOUNTER — Telehealth: Payer: Self-pay | Admitting: Hematology and Oncology

## 2020-11-04 DIAGNOSIS — Z7982 Long term (current) use of aspirin: Secondary | ICD-10-CM | POA: Insufficient documentation

## 2020-11-04 DIAGNOSIS — R7989 Other specified abnormal findings of blood chemistry: Secondary | ICD-10-CM | POA: Insufficient documentation

## 2020-11-04 DIAGNOSIS — Z885 Allergy status to narcotic agent status: Secondary | ICD-10-CM | POA: Insufficient documentation

## 2020-11-04 DIAGNOSIS — D751 Secondary polycythemia: Secondary | ICD-10-CM | POA: Diagnosis present

## 2020-11-04 DIAGNOSIS — Z832 Family history of diseases of the blood and blood-forming organs and certain disorders involving the immune mechanism: Secondary | ICD-10-CM | POA: Insufficient documentation

## 2020-11-04 DIAGNOSIS — I10 Essential (primary) hypertension: Secondary | ICD-10-CM | POA: Insufficient documentation

## 2020-11-04 DIAGNOSIS — R252 Cramp and spasm: Secondary | ICD-10-CM | POA: Diagnosis not present

## 2020-11-04 DIAGNOSIS — Z79899 Other long term (current) drug therapy: Secondary | ICD-10-CM | POA: Insufficient documentation

## 2020-11-04 DIAGNOSIS — R0789 Other chest pain: Secondary | ICD-10-CM | POA: Diagnosis not present

## 2020-11-04 DIAGNOSIS — Z880 Allergy status to penicillin: Secondary | ICD-10-CM | POA: Insufficient documentation

## 2020-11-04 DIAGNOSIS — R519 Headache, unspecified: Secondary | ICD-10-CM | POA: Diagnosis not present

## 2020-11-04 DIAGNOSIS — R0981 Nasal congestion: Secondary | ICD-10-CM | POA: Diagnosis not present

## 2020-11-04 LAB — CBC WITH DIFFERENTIAL/PLATELET
Abs Immature Granulocytes: 0.03 10*3/uL (ref 0.00–0.07)
Basophils Absolute: 0.1 10*3/uL (ref 0.0–0.1)
Basophils Relative: 1 %
Eosinophils Absolute: 0.2 10*3/uL (ref 0.0–0.5)
Eosinophils Relative: 4 %
HCT: 43.5 % (ref 39.0–52.0)
Hemoglobin: 15.4 g/dL (ref 13.0–17.0)
Immature Granulocytes: 1 %
Lymphocytes Relative: 24 %
Lymphs Abs: 1.4 10*3/uL (ref 0.7–4.0)
MCH: 30.5 pg (ref 26.0–34.0)
MCHC: 35.4 g/dL (ref 30.0–36.0)
MCV: 86.1 fL (ref 80.0–100.0)
Monocytes Absolute: 0.7 10*3/uL (ref 0.1–1.0)
Monocytes Relative: 12 %
Neutro Abs: 3.3 10*3/uL (ref 1.7–7.7)
Neutrophils Relative %: 58 %
Platelets: 231 10*3/uL (ref 150–400)
RBC: 5.05 MIL/uL (ref 4.22–5.81)
RDW: 12.2 % (ref 11.5–15.5)
WBC: 5.7 10*3/uL (ref 4.0–10.5)
nRBC: 0 % (ref 0.0–0.2)

## 2020-11-04 LAB — FERRITIN: Ferritin: 199 ng/mL (ref 24–336)

## 2020-11-04 NOTE — Telephone Encounter (Signed)
Scheduled appointment per 96/09 los. Patient is aware.

## 2020-11-04 NOTE — Assessment & Plan Note (Signed)
The erythrocytosis is resolved I explained to the patient that his condition is benign He has successfully lost weight and felt better He is on aspirin therapy He is hydrating well Is attempting to discontinue alcohol intake completely He has appointment to see other physicians within the next 4 to 6 months I recommend CBC to be done with other physician and he can inform me with test results I will see him back if his hemoglobin is high requiring phlebotomy again Otherwise, I plan to see him next year

## 2020-11-04 NOTE — Progress Notes (Signed)
[facility] OFFICE PROGRESS NOTE  [name], MD  ASSESSMENT & PLAN:  Secondary erythrocytosis The erythrocytosis is resolved I explained to the patient that his condition is benign He has successfully lost weight and felt better He is on aspirin therapy He is hydrating well Is attempting to discontinue alcohol intake completely He has appointment to see other physicians within the next 4 to 6 months I recommend CBC to be done with other physician and he can inform me with test results I will see him back if his hemoglobin is high requiring phlebotomy again Otherwise, I plan to see him next year  Essential hypertension His blood pressure is much improved He is taking additional blood pressure medication since her last visit We discussed the importance of lifestyle modification and exercise  No orders of the defined types were placed in this encounter.   The total time spent in the appointment was 20 minutes encounter with patients including review of chart and various tests results, discussions about plan of care and coordination of care plan   All questions were answered. The patient knows to call the clinic with any problems, questions or concerns. No barriers to learning was detected.    Heath Lark, MD 6/9/202210:51 AM  INTERVAL HISTORY: Kevin Vega 70 y.o. male returns for follow-up for secondary erythrocytosis He is doing well Have lost some weight He is attempting to drink more liquids He is taking aspirin He denies excessive use of NSAID His blood pressure is better controlled with additional blood pressure medication  SUMMARY OF HEMATOLOGIC HISTORY: RODRIGO MCGRANAHAN 70 y.o. male is here because of elevated hemoglobin. He is here accompanied by his wife, [name] [name] is retired  He was found to have abnormal CBC from his primary care physician's office test results Based on scanned information, his latest hemoglobin was around  17 According to our electronic records dated from 12/24/2007 to January 02, 2012, his hemoglobin ranged between 16.1-16.8 He has chronic headaches which he attributed to sinus congestion and frequent intermittent leg cramps and occasional chest discomfort He is undergoing aggressive evaluation including coronary calcium score with CT imaging next month His blood pressure medication was recently increased because his systolic blood pressure average around 400 with diastolic around 90 He never suffer from diagnosis of blood clot.  There is no prior diagnosis of obstructive sleep apnea, although his wife did say that he might snore. The patient denies weight loss or skin itching. 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  Serum erythropoietin level was normal.  LDH was normal.  Peripheral blood for JAK2 mutation and MPD panel were within normal range  I have reviewed the past medical history, past surgical history, social history and family history with the patient and they are unchanged from previous note.  ALLERGIES:  is allergic to codeine and penicillins.  MEDICATIONS:  Current Outpatient  Medications  Medication Sig Dispense Refill   aspirin EC 81 MG tablet Take 81 mg by mouth daily. Swallow whole.     Cetirizine HCl (ZYRTEC ALLERGY) 10 MG CAPS Take by mouth.      cholecalciferol (VITAMIN D) 1000 UNITS tablet Take 2,000 Units by mouth daily.      Cyanocobalamin (VITAMIN B-12) 2500 MCG SUBL Place 2,500 mcg under the tongue daily.     fluticasone (FLONASE) 50 MCG/ACT nasal spray Place 2 sprays into the nose as needed.       naproxen sodium (ANAPROX) 220 MG tablet Take 220 mg by mouth as needed.       NON FORMULARY 40 mLs at bedtime. CBD Tincture      telmisartan-hydrochlorothiazide (MICARDIS HCT) 80-25 MG tablet Take 1 tablet by mouth daily.     No current facility-administered medications for this visit.      I have reviewed the past medical history, past surgical history, social history and family history with the patient and they are unchanged from previous note.  ALLERGIES:  is allergic to codeine and penicillins.  MEDICATIONS:  Current Outpatient Medications  Medication Sig Dispense Refill   amLODipine (NORVASC) 5 MG tablet Take 5 mg by mouth daily.     hydrochlorothiazide (HYDRODIURIL) 25 MG tablet Take 25 mg by mouth daily.     rosuvastatin (CRESTOR) 20 MG tablet Take 20 mg by mouth daily.     aspirin EC 81 MG tablet Take 81 mg by mouth daily. Swallow whole.     Cetirizine HCl (ZYRTEC ALLERGY) 10 MG CAPS Take by mouth.     cholecalciferol (VITAMIN D) 1000 UNITS tablet Take 2,000 Units by mouth daily.      Cyanocobalamin (VITAMIN B-12) 2500 MCG SUBL Place 1,000 mcg under the tongue daily.     naproxen sodium (ANAPROX) 220 MG tablet Take 220 mg by mouth as needed.       NON FORMULARY 40 mLs at bedtime. CBD Tincture      telmisartan-hydrochlorothiazide (MICARDIS HCT) 80-25 MG tablet Take 1 tablet by mouth daily.     No current facility-administered medications for this visit.     REVIEW OF SYSTEMS:   Constitutional: Denies fevers, chills or night sweats Eyes: Denies blurriness of vision Ears, nose, mouth, throat, and face: Denies mucositis or sore throat Respiratory: Denies cough, dyspnea or wheezes Cardiovascular: Denies palpitation,  chest discomfort or lower extremity swelling Gastrointestinal:  Denies nausea, heartburn or change in bowel habits Skin: Denies abnormal skin rashes Lymphatics: Denies new lymphadenopathy or easy bruising Neurological:Denies numbness, tingling or new weaknesses Behavioral/Psych: Mood is stable, no new changes  All other systems were reviewed with the patient and are negative.  PHYSICAL EXAMINATION: ECOG PERFORMANCE STATUS: 0 - Asymptomatic  Vitals:   11/04/20 0806  BP: 137/71  Pulse: (!) 59  Resp: 18  Temp: 98.5 F (36.9 C)  SpO2: 100%   Filed Weights   11/04/20 0806  Weight: 169 lb 6.4 oz (76.8 kg)    GENERAL:alert, no distress and comfortable  NEURO: alert & oriented x 3 with fluent speech, no focal motor/sensory deficits  LABORATORY DATA:  I have reviewed the data as listed     Component Value Date/Time   NA 137 01/02/2012 0926   K 4.6 01/02/2012 0926   CL 102 01/02/2012 0926   CO2 28 01/02/2012 0926   GLUCOSE 91 01/02/2012 0926   BUN 14 01/02/2012 0926   CREATININE 1.0 01/02/2012 0926   CALCIUM 9.1 01/02/2012 0926  PROT 7.2 05/06/2014 0951   ALBUMIN 4.2 05/06/2014 0951   AST 38 (H) 05/06/2014 0951   ALT 39 05/06/2014 0951   ALKPHOS 71 05/06/2014 0951   BILITOT 0.7 05/06/2014 0951   GFRNONAA 70.59 11/26/2009 1429   GFRAA 99 07/07/2008 1503    No results found for: SPEP, UPEP  Lab Results  Component Value Date   WBC 5.7 11/04/2020   NEUTROABS 3.3 11/04/2020   HGB 15.4 11/04/2020   HCT 43.5 11/04/2020   MCV 86.1 11/04/2020   PLT 231 11/04/2020      Chemistry      Component Value Date/Time   NA 137 01/02/2012 0926   K 4.6 01/02/2012 0926   CL 102 01/02/2012 0926   CO2 28 01/02/2012 0926   BUN 14 01/02/2012 0926   CREATININE 1.0 01/02/2012 0926      Component Value Date/Time   CALCIUM 9.1 01/02/2012 0926   ALKPHOS 71 05/06/2014 0951   AST 38 (H) 05/06/2014 0951   ALT 39 05/06/2014 0951   BILITOT 0.7 05/06/2014 0951

## 2020-11-04 NOTE — Assessment & Plan Note (Signed)
His blood pressure is much improved He is taking additional blood pressure medication since her last visit We discussed the importance of lifestyle modification and exercise

## 2021-02-18 ENCOUNTER — Encounter: Payer: Self-pay | Admitting: Gastroenterology

## 2021-03-23 ENCOUNTER — Other Ambulatory Visit: Payer: Self-pay

## 2021-03-23 ENCOUNTER — Encounter: Payer: Self-pay | Admitting: Cardiovascular Disease

## 2021-03-23 ENCOUNTER — Ambulatory Visit (INDEPENDENT_AMBULATORY_CARE_PROVIDER_SITE_OTHER): Payer: Medicare Other | Admitting: Cardiovascular Disease

## 2021-03-23 VITALS — BP 140/76 | HR 67 | Ht 69.0 in | Wt 174.0 lb

## 2021-03-23 DIAGNOSIS — R931 Abnormal findings on diagnostic imaging of heart and coronary circulation: Secondary | ICD-10-CM | POA: Diagnosis not present

## 2021-03-23 DIAGNOSIS — E782 Mixed hyperlipidemia: Secondary | ICD-10-CM

## 2021-03-23 DIAGNOSIS — I10 Essential (primary) hypertension: Secondary | ICD-10-CM | POA: Diagnosis not present

## 2021-03-23 NOTE — Assessment & Plan Note (Signed)
History of essential hypertension a blood pressure measured today at 140/76.  He is on amlodipine, hydrochlorothiazide and Micardis.

## 2021-03-23 NOTE — Assessment & Plan Note (Signed)
History of hyperlipidemia on statin therapy with recent lipid profile performed by his PCP 09/27/2020 revealing total cholesterol 114, LDL 58 and HDL 42.

## 2021-03-23 NOTE — Patient Instructions (Signed)
Medication Instructions:  No Changes In Medications at this time.  *If you need a refill on your cardiac medications before your next appointment, please call your pharmacy*  Follow-Up: At [facility], you and your health needs are our priority.  As part of our continuing mission to provide you with exceptional heart care, we have created designated Provider Care Teams.  These Care Teams include your primary Cardiologist (physician) and Advanced Practice Providers (APPs -  Physician Assistants and Nurse Practitioners) who all work together to provide you with the care you need, when you need it.  Your next appointment:   AS NEEDED   The format for your next appointment:   In Person  Provider:   Quay Burow, MD

## 2021-03-23 NOTE — Progress Notes (Signed)
03/23/2021 Kevin Vega   [birth year]  [id number]  Primary Physician [name], MD Primary Cardiologist: Lorretta Harp MD Renae Gloss  HPI:  Kevin Vega is a 70 y.o.  only overweight married Caucasian male father of 2, grandfather 3 grandchildren referred by Dr. Shelia Media for evaluation of chest pain or shortness of breath.  I last saw him in the office 02/11/2019 he had. 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  Risk factors are notable for treated hypertension untreated mild hyperlipidemia.  He is never had a heart attack or stroke.  There is no family history.  Is noticed increasing dyspnea on exertion and occasional chest pain on exertion over the last several months.   He had a Myoview stress test on him 01/09/2018 which was nonischemic and low risk that 2D echo performed 01/03/2018 which was normal as well.    Since I saw him 2 years ago he did have a coronary calcium score performed 08/09/2020 which was 42 with calcium in all 3 coronary arteries primarily circumflex and RCA.  He is completely asymptomatic.  His most recent lipid profile performed by his PCP 09/27/2020 revealed a total cholesterol of 114, LDL of 58 and HDL 42.  Current Meds  Medication Sig   amLODipine (NORVASC) 5 MG tablet Take 5 mg by mouth daily.   aspirin EC 81 MG tablet Take 81 mg by mouth daily. Swallow whole.   Cetirizine HCl 10 MG CAPS Take by mouth daily in the afternoon.   Cholecalciferol (D3) 50 MCG (2000 UT) TABS Take 1 tablet by mouth daily in the afternoon.   Cyanocobalamin (VITAMIN B-12) 2500 MCG SUBL Place 1,000 mcg under the tongue daily.   hydrochlorothiazide (HYDRODIURIL) 25 MG tablet Take 25 mg by mouth daily.   naproxen sodium (ANAPROX) 220 MG tablet Take 220 mg by mouth as needed.     NON FORMULARY 40 mLs at bedtime. CBD Tincture    rosuvastatin (CRESTOR) 20 MG tablet Take 20 mg by mouth daily.    telmisartan-hydrochlorothiazide (MICARDIS HCT) 80-25 MG tablet Take 1 tablet by mouth daily.     Allergies  Allergen Reactions   Codeine     Causes anxiety   Penicillins     Per pt: as child; unknown reaction    Social History   Socioeconomic History   Marital status: Married    Spouse name: Not on file   Number of children: 2   Years of education: Not on file   Highest education level: Not on file  Occupational History   Occupation: Games developer  Tobacco Use   Smoking status: Never   Smokeless tobacco: Never  Substance and Sexual Activity   Alcohol use: Yes    Comment: 2 beers daily   Drug use: No   Sexual activity: Not on file  Other Topics Concern   Not on file  Social History Narrative   Not on file   Social Determinants of Health   Financial Resource Strain: Not on file  Food Insecurity: Not on file  Transportation Needs: Not on file  Physical Activity: Not on file  Stress: Not on file  Social Connections: Not on file  Intimate Partner Violence: Not on file     Review of Systems: General: negative for chills, fever, night sweats or weight changes.  Cardiovascular: negative for chest pain, dyspnea on exertion, edema, orthopnea, palpitations, paroxysmal nocturnal dyspnea or  shortness of breath Dermatological: negative for rash Respiratory: negative for cough or wheezing Urologic: negative for hematuria Abdominal: negative for nausea, vomiting, diarrhea, bright red blood per rectum, melena, or hematemesis Neurologic: negative for visual changes, syncope, or dizziness All other systems reviewed and are otherwise negative except as noted above.    Blood pressure 140/76, pulse 67, height 5\' 9"  (1.753 m), weight 174 lb (78.9 kg), SpO2 100 %.  General appearance: alert and no distress Neck: no adenopathy, no carotid bruit, no JVD, supple, symmetrical, trachea midline, and thyroid not enlarged, symmetric, no tenderness/mass/nodules Lungs: clear to auscultation  bilaterally Heart: regular rate and rhythm, S1, S2 normal, no murmur, click, rub or gallop Extremities: extremities normal, atraumatic, no cyanosis or edema Pulses: 2+ and symmetric Skin: Skin color, texture, turgor normal. No rashes or lesions Neurologic: Grossly normal  EKG sinus rhythm at 67 without ST or T wave changes.  I personally reviewed this EKG.  ASSESSMENT AND PLAN:   Elevated coronary artery calcium score Mr. Colan ubit had a coronary calcium score performed 08/09/2020 revealing a total coronary calcium of 42 with the majority in the RCA and circumflex.  He denies chest pain or shortness of breath.  He did have a negative Myoview stress test and 2D echo 2019.  Essential hypertension History of essential hypertension a blood pressure measured today at 140/76.  He is on amlodipine, hydrochlorothiazide and Micardis.  Hyperlipidemia History of hyperlipidemia on statin therapy with recent lipid profile performed by his PCP 09/27/2020 revealing total cholesterol 114, LDL 58 and HDL 42.     Lorretta Harp MD FACP,FACC,FAHA, [facility] 03/23/2021 1:42 PM

## 2021-03-23 NOTE — Assessment & Plan Note (Signed)
Mr. Kevin Vega ubit had a coronary calcium score performed 08/09/2020 revealing a total coronary calcium of 42 with the majority in the RCA and circumflex.  He denies chest pain or shortness of breath.  He did have a negative Myoview stress test and 2D echo 2019.

## 2021-04-12 ENCOUNTER — Other Ambulatory Visit: Payer: Self-pay

## 2021-04-12 ENCOUNTER — Encounter: Payer: Self-pay | Admitting: Gastroenterology

## 2021-04-12 ENCOUNTER — Ambulatory Visit (AMBULATORY_SURGERY_CENTER): Payer: Self-pay

## 2021-04-12 VITALS — Ht 69.0 in | Wt 173.0 lb

## 2021-04-12 DIAGNOSIS — Z800 Family history of malignant neoplasm of digestive organs: Secondary | ICD-10-CM

## 2021-04-12 DIAGNOSIS — Z86010 Personal history of colonic polyps: Secondary | ICD-10-CM

## 2021-04-12 MED ORDER — PEG 3350-KCL-NA BICARB-NACL 420 G PO SOLR: 4000.0000 mL | Freq: Once | ORAL | 0 refills | Status: AC

## 2021-04-12 NOTE — Progress Notes (Signed)
No allergies to soy or egg Pt is not on blood thinners or diet pills Denies issues with sedation/intubation Denies atrial flutter/fib Denies constipation   Emmi instructions given to pt  Pt is aware of Covid safety and care partner requirements.  

## 2021-04-26 ENCOUNTER — Encounter: Payer: Self-pay | Admitting: Gastroenterology

## 2021-04-26 ENCOUNTER — Ambulatory Visit (AMBULATORY_SURGERY_CENTER): Payer: Medicare Other | Admitting: Gastroenterology

## 2021-04-26 ENCOUNTER — Other Ambulatory Visit: Payer: Self-pay

## 2021-04-26 VITALS — BP 115/70 | HR 61 | Temp 98.0°F | Resp 19 | Ht 69.0 in | Wt 173.0 lb

## 2021-04-26 DIAGNOSIS — Z86010 Personal history of colonic polyps: Secondary | ICD-10-CM | POA: Diagnosis not present

## 2021-04-26 DIAGNOSIS — D128 Benign neoplasm of rectum: Secondary | ICD-10-CM | POA: Diagnosis not present

## 2021-04-26 DIAGNOSIS — Z800 Family history of malignant neoplasm of digestive organs: Secondary | ICD-10-CM

## 2021-04-26 MED ORDER — SODIUM CHLORIDE 0.9 % IV SOLN: 500.0000 mL | Freq: Once | INTRAVENOUS | Status: DC

## 2021-04-26 NOTE — Progress Notes (Signed)
Pt's states no medical or surgical changes since previsit or office visit.   CHECK-IN-AER  [id number]

## 2021-04-26 NOTE — Progress Notes (Signed)
No problems noted in the recovery room. maw 

## 2021-04-26 NOTE — Progress Notes (Signed)
Personal history of tubular adenomas 3 polyps removed by Dr. Earlean Shawl during colonoscopy in early 0272; complicated by post polypectomy bleeding.  Dr. Earlean Shawl did not see him in hosp after the complication, and so he changed care. Colonoscopy June 2010 by Dr. Ardis Hughs found one 7 mm polyp that was hyperplastic.11/2013 Colonoscopy Dr. Ardis Hughs: two subcentimeter adenomas removed   HPI: This is a man with h/o polyps   ROS: complete GI ROS as described in HPI, all other review negative.  Constitutional:  No unintentional weight loss   Past Medical History:  Diagnosis Date   Alcohol abuse, unspecified    Allergy    Anxiety    Arthritis    Blood transfusion without reported diagnosis 1959   as child   Chest pain    Chronic hepatitis C without mention of hepatic coma    Hemangioma of intra-abdominal structures    Hyperlipidemia    Hypertension    Insomnia, unspecified    Personal history of colonic polyps     Past Surgical History:  Procedure Laterality Date   bilateral inguinal hernia  [place]     UMBILICAL HERNIA REPAIR  2003    Current Outpatient Medications  Medication Sig Dispense Refill   amLODipine (NORVASC) 5 MG tablet Take 5 mg by mouth daily.     aspirin EC 81 MG tablet Take 81 mg by mouth daily. Swallow whole.     Cetirizine HCl 10 MG CAPS Take by mouth daily in the afternoon.     Cholecalciferol (D3) 50 MCG (2000 UT) TABS Take 1 tablet by mouth daily in the afternoon.     Cyanocobalamin (VITAMIN B-12) 2500 MCG SUBL Place 1,000 mcg under the tongue daily.     hydrochlorothiazide (HYDRODIURIL) 25 MG tablet Take 25 mg by mouth daily.     naproxen sodium (ANAPROX) 220 MG tablet Take 220 mg by mouth as needed.       NON FORMULARY 40 mLs at bedtime. CBD Tincture      rosuvastatin (CRESTOR) 20 MG tablet Take 20 mg by mouth daily.     telmisartan-hydrochlorothiazide (MICARDIS HCT) 80-25 MG tablet Take 1 tablet by mouth daily.      cholecalciferol (VITAMIN D) 1000 UNITS tablet Take 2,000 Units by mouth daily.  (Patient not taking: Reported on 03/23/2021)     diclofenac Sodium (VOLTAREN) 1 % GEL See admin instructions.     Current Facility-Administered Medications  Medication Dose Route Frequency Provider Last Rate Last Admin   0.9 %  sodium chloride infusion  500 mL Intravenous Once Milus Banister, MD        Allergies as of 04/26/2021 - Review Complete 04/26/2021  Allergen Reaction Noted   Chlorthalidone  02/08/2021   Codeine  12/12/2013   Penicillins  10/08/2007    Family History  Problem Relation Age of Onset   Stroke Mother    Polycythemia Mother    Colon cancer Father 38   Parkinson's disease Father    Polycythemia Paternal Uncle        polycythemia   Cancer Maternal Grandmother    Colon polyps Neg Hx    Esophageal cancer Neg Hx    Stomach cancer Neg Hx    Rectal cancer Neg Hx     Social History   Socioeconomic History   Marital status: Married    Spouse name: Not on file   Number of children: 2   Years of education: Not  on file   Highest education level: Not on file  Occupational History   Occupation: carpenter  Tobacco Use   Smoking status: Never   Smokeless tobacco: Never  Vaping Use   Vaping Use: Never used  Substance and Sexual Activity   Alcohol use: Yes    Comment: 2 beers daily   Drug use: No   Sexual activity: Not on file  Other Topics Concern   Not on file  Social History Narrative   Not on file   Social Determinants of Health   Financial Resource Strain: Not on file  Food Insecurity: Not on file  Transportation Needs: Not on file  Physical Activity: Not on file  Stress: Not on file  Social Connections: Not on file  Intimate Partner Violence: Not on file     Physical Exam: BP 124/70 (Patient Position: Sitting)   Pulse 69   Temp 98 F (36.7 C)   Ht 5\' 9"  (1.753 m)   Wt 173 lb (78.5 kg)   SpO2 99%   BMI 25.55 kg/m  Constitutional: generally  well-appearing Psychiatric: alert and oriented x3 Lungs: CTA bilaterally Heart: no MCR  Assessment and plan: 70 y.o. male with h/o polyps  Colonosdcopy today  Care is appropriate for the ambulatory setting.  Kevin Loffler, MD [facility] 04/26/2021, 7:39 AM

## 2021-04-26 NOTE — Op Note (Signed)
[name] Patient Name: Kevin Vega Procedure Date: 04/26/2021 7:39 AM MRN: [id number] Endoscopist: Milus Banister , MD Age: 70 Referring MD:  Date of Birth: May 10, 1951 Gender: Male Account #: [id number] Procedure:                Colonoscopy Indications:              High risk colon cancer surveillance: Personal                            history of colonic polyps; 3 polyps removed by Dr.                            Earlean Shawl during colonoscopy in early 2007;                            complicated by post polypectomy bleeding. Dr.                            Earlean Shawl did not see him in hosp after the                            complication, and so he changed care. Colonoscopy                            June 2010 by Dr. Ardis Hughs found one 7 mm polyp that                            was hyperplastic.11/2013 Colonoscopy Dr. Ardis Hughs: two                            subcentimeter adenomas removed Medicines:                Monitored Anesthesia Care Procedure:                Pre-Anesthesia Assessment:                           - Prior to the procedure, a History and Physical                            was performed, and patient medications and                            allergies were reviewed. The patient's tolerance of                            previous anesthesia was also reviewed. The risks                            and benefits of the procedure and the sedation                            options and risks were discussed with the patient.  All questions were answered, and informed consent                            was obtained. Prior Anticoagulants: The patient has                            taken no previous anticoagulant or antiplatelet                            agents. ASA Grade Assessment: II - A patient with                            mild systemic disease. After reviewing the risks                            and benefits, the patient was deemed in                             satisfactory condition to undergo the procedure.                           After obtaining informed consent, the colonoscope                            was passed under direct vision. Throughout the                            procedure, the patient's blood pressure, pulse, and                            oxygen saturations were monitored continuously. The                            CF [id number] #[id number] was introduced through the anus                            and advanced to the the cecum, identified by                            appendiceal orifice and ileocecal valve. The                            colonoscopy was performed without difficulty. The                            patient tolerated the procedure well. The quality                            of the bowel preparation was good. The ileocecal                            valve, appendiceal orifice, and rectum were  photographed. Scope In: 7:59:13 AM Scope Out: 8:10:42 AM Scope Withdrawal Time: 0 hours 9 minutes 8 seconds  Total Procedure Duration: 0 hours 11 minutes 29 seconds  Findings:                 A 3 mm polyp was found in the distal rectum. The                            polyp was sessile. The polyp was removed with a                            cold snare. Resection and retrieval were complete.                           A few small and large-mouthed diverticula were                            found in the left colon.                           The exam was otherwise without abnormality on                            direct and retroflexion views. Complications:            No immediate complications. Estimated blood loss:                            None. Estimated Blood Loss:     Estimated blood loss: none. Impression:               - One 3 mm polyp in the distal rectum, removed with                            a cold snare. Resected and retrieved.                           -  Diverticulosis in the left colon.                           - The examination was otherwise normal on direct                            and retroflexion views. Recommendation:           - Patient has a contact number available for                            emergencies. The signs and symptoms of potential                            delayed complications were discussed with the                            patient. Return to normal activities tomorrow.  Written discharge instructions were provided to the                            patient.                           - Resume previous diet.                           - Continue present medications.                           - Await pathology results. Milus Banister, MD 04/26/2021 8:12:46 AM This report has been signed electronically.

## 2021-04-26 NOTE — Progress Notes (Signed)
Called to room to assist during endoscopic procedure.  Patient ID and intended procedure confirmed with present staff. Received instructions for my participation in the procedure from the performing physician.  

## 2021-04-26 NOTE — Progress Notes (Signed)
A and O x3. Report to RN. Tolerated MAC anesthesia well. 

## 2021-04-26 NOTE — Patient Instructions (Signed)
Handouts were given to your care partner on polyps and diverticulosis. You may resume your current medications today. Await biopsy results.  May take 1-3 weeks to receive pathology results. Please call if any questions or concerns.     YOU HAD AN ENDOSCOPIC PROCEDURE TODAY AT [facility]:   Refer to the procedure report that was given to you for any specific questions about what was found during the examination.  If the procedure report does not answer your questions, please call your gastroenterologist to clarify.  If you requested that your care partner not be given the details of your procedure findings, then the procedure report has been included in a sealed envelope for you to review at your convenience later.  YOU SHOULD EXPECT: Some feelings of bloating in the abdomen. Passage of more gas than usual.  Walking can help get rid of the air that was put into your GI tract during the procedure and reduce the bloating. If you had a lower endoscopy (such as a colonoscopy or flexible sigmoidoscopy) you may notice spotting of blood in your stool or on the toilet paper. If you underwent a bowel prep for your procedure, you may not have a normal bowel movement for a few days.  Please Note:  You might notice some irritation and congestion in your nose or some drainage.  This is from the oxygen used during your procedure.  There is no need for concern and it should clear up in a day or so.  SYMPTOMS TO REPORT IMMEDIATELY:  Following lower endoscopy (colonoscopy or flexible sigmoidoscopy):  Excessive amounts of blood in the stool  Significant tenderness or worsening of abdominal pains  Swelling of the abdomen that is new, acute  Fever of 100F or higher  For urgent or emergent issues, a gastroenterologist can be reached at any hour by calling [phone]. Do not use MyChart messaging for urgent concerns.    DIET:  We do recommend a small meal at first, but then you may proceed  to your regular diet.  Drink plenty of fluids but you should avoid alcoholic beverages for 24 hours.  ACTIVITY:  You should plan to take it easy for the rest of today and you should NOT DRIVE or use heavy machinery until tomorrow (because of the sedation medicines used during the test).    FOLLOW UP: Our staff will call the number listed on your records 48-72 hours following your procedure to check on you and address any questions or concerns that you may have regarding the information given to you following your procedure. If we do not reach you, we will leave a message.  We will attempt to reach you two times.  During this call, we will ask if you have developed any symptoms of COVID 19. If you develop any symptoms (ie: fever, flu-like symptoms, shortness of breath, cough etc.) before then, please call [phone].  If you test positive for Covid 19 in the 2 weeks post procedure, please call and report this information to [place].    If any biopsies were taken you will be contacted by phone or by letter within the next 1-3 weeks.  Please call us at [phone] if you have not heard about the biopsies in 3 weeks.    SIGNATURES/CONFIDENTIALITY: You and/or your care partner have signed paperwork which will be entered into your electronic medical record.  These signatures attest to the fact that that the information above on your After Visit Summary has  been reviewed and is understood.  Full responsibility of the confidentiality of this discharge information lies with you and/or your care-partner.

## 2021-04-28 ENCOUNTER — Telehealth: Payer: Self-pay

## 2021-04-28 NOTE — Telephone Encounter (Signed)
  Follow up Call-  Call back number 04/26/2021  Post procedure Call Back phone  # [phone]  Permission to leave phone message Yes  Some recent data might be hidden     Patient questions:  Do you have a fever, pain , or abdominal swelling? No. Pain Score  0 *  Have you tolerated food without any problems? Yes.    Have you been able to return to your normal activities? Yes.    Do you have any questions about your discharge instructions: Diet   No. Medications  No. Follow up visit  No.  Do you have questions or concerns about your Care? No.  Actions: * If pain score is 4 or above: No action needed, pain <4.

## 2021-04-29 ENCOUNTER — Encounter: Payer: Self-pay | Admitting: Gastroenterology

## 2021-11-04 ENCOUNTER — Inpatient Hospital Stay: Payer: Medicare Other | Attending: Hematology and Oncology

## 2021-11-04 ENCOUNTER — Encounter: Payer: Self-pay | Admitting: Hematology and Oncology

## 2021-11-04 ENCOUNTER — Other Ambulatory Visit: Payer: Self-pay

## 2021-11-04 ENCOUNTER — Inpatient Hospital Stay (HOSPITAL_BASED_OUTPATIENT_CLINIC_OR_DEPARTMENT_OTHER): Payer: Medicare Other | Admitting: Hematology and Oncology

## 2021-11-04 DIAGNOSIS — D751 Secondary polycythemia: Secondary | ICD-10-CM

## 2021-11-04 DIAGNOSIS — Z79899 Other long term (current) drug therapy: Secondary | ICD-10-CM | POA: Diagnosis not present

## 2021-11-04 DIAGNOSIS — Z7982 Long term (current) use of aspirin: Secondary | ICD-10-CM | POA: Diagnosis not present

## 2021-11-04 DIAGNOSIS — I10 Essential (primary) hypertension: Secondary | ICD-10-CM

## 2021-11-04 LAB — CBC WITH DIFFERENTIAL/PLATELET
Abs Immature Granulocytes: 0.02 10*3/uL (ref 0.00–0.07)
Basophils Absolute: 0.1 10*3/uL (ref 0.0–0.1)
Basophils Relative: 1 %
Eosinophils Absolute: 0.2 10*3/uL (ref 0.0–0.5)
Eosinophils Relative: 4 %
HCT: 45.1 % (ref 39.0–52.0)
Hemoglobin: 16.2 g/dL (ref 13.0–17.0)
Immature Granulocytes: 0 %
Lymphocytes Relative: 24 %
Lymphs Abs: 1.1 10*3/uL (ref 0.7–4.0)
MCH: 30.2 pg (ref 26.0–34.0)
MCHC: 35.9 g/dL (ref 30.0–36.0)
MCV: 84.1 fL (ref 80.0–100.0)
Monocytes Absolute: 0.6 10*3/uL (ref 0.1–1.0)
Monocytes Relative: 14 %
Neutro Abs: 2.5 10*3/uL (ref 1.7–7.7)
Neutrophils Relative %: 57 %
Platelets: 244 10*3/uL (ref 150–400)
RBC: 5.36 MIL/uL (ref 4.22–5.81)
RDW: 12.2 % (ref 11.5–15.5)
WBC: 4.5 10*3/uL (ref 4.0–10.5)
nRBC: 0 % (ref 0.0–0.2)

## 2021-11-04 LAB — FERRITIN: Ferritin: 107 ng/mL (ref 24–336)

## 2021-11-04 NOTE — Assessment & Plan Note (Signed)
The erythrocytosis is resolved I explained to the patient that his condition is benign He has successfully lost weight and felt better He is on aspirin therapy He is hydrating well I recommend CBC to be done with his primary physician and he can inform me with test results There is no need for long-term follow-up

## 2021-11-04 NOTE — Progress Notes (Signed)
[place] OFFICE PROGRESS NOTE  Patient Care Team: Kevin Pretty, MD as PCP - General (Internal Medicine)  ASSESSMENT & PLAN:  Secondary erythrocytosis The erythrocytosis is resolved I explained to the patient that his condition is benign He has successfully lost weight and felt better He is on aspirin therapy He is hydrating well I recommend CBC to be done with his primary physician and he can inform me with test results There is no need for long-term follow-up  Essential hypertension He is on multiple antihypertensives We discussed importance of lifestyle modification, weight loss and dietary modification I am hopeful he can eventually come off a few of his blood pressure medications with his continuous effort   No orders of the defined types were placed in this encounter.   All questions were answered. The patient knows to call the clinic with any problems, questions or concerns. The total time spent in the appointment was 20 minutes encounter with patients including review of chart and various tests results, discussions about plan of care and coordination of care plan   Kevin Lark, MD 11/04/2021 11:16 AM  INTERVAL HISTORY: Please see below for problem oriented charting. he returns for surveillance follow-up for history of erythrocytosis He is doing well He is very active He is losing weight due to dietary modification  REVIEW OF SYSTEMS:   Constitutional: Denies fevers, chills or abnormal weight loss Eyes: Denies blurriness of vision Ears, nose, mouth, throat, and face: Denies mucositis or sore throat Respiratory: Denies cough, dyspnea or wheezes Cardiovascular: Denies palpitation, chest discomfort or lower extremity swelling Gastrointestinal:  Denies nausea, heartburn or change in bowel habits Skin: Denies abnormal skin rashes Lymphatics: Denies new lymphadenopathy or easy bruising Neurological:Denies numbness, tingling or new  weaknesses Behavioral/Psych: Mood is stable, no new changes  All other systems were reviewed with the patient and are negative.  I have reviewed the past medical history, past surgical history, social history and family history with the patient and they are unchanged from previous note.  ALLERGIES:  is allergic to chlorthalidone, codeine, and penicillins.  MEDICATIONS:  Current Outpatient Medications  Medication Sig Dispense Refill   amLODipine (NORVASC) 5 MG tablet Take 5 mg by mouth daily.     aspirin EC 81 MG tablet Take 81 mg by mouth daily. Swallow whole.     Cetirizine HCl 10 MG CAPS Take by mouth daily in the afternoon.     Cholecalciferol (D3) 50 MCG (2000 UT) TABS Take 1 tablet by mouth daily in the afternoon.     Cyanocobalamin (VITAMIN B-12) 2500 MCG SUBL Place 1,000 mcg under the tongue daily.     diclofenac Sodium (VOLTAREN) 1 % GEL See admin instructions.     hydrochlorothiazide (HYDRODIURIL) 25 MG tablet Take 25 mg by mouth daily.     naproxen sodium (ANAPROX) 220 MG tablet Take 220 mg by mouth as needed.       NON FORMULARY 40 mLs at bedtime. CBD Tincture      rosuvastatin (CRESTOR) 20 MG tablet Take 20 mg by mouth daily.     telmisartan-hydrochlorothiazide (MICARDIS HCT) 80-25 MG tablet Take 1 tablet by mouth daily.     No current facility-administered medications for this visit.    SUMMARY OF ONCOLOGIC HISTORY: Kevin Vega is here because of elevated hemoglobin. He is here accompanied by his wife, Kevin Vega "Kevin Vega" is retired  He was found to have abnormal CBC from his primary care physician's office test results Based on scanned information, his  latest hemoglobin was around 17 According to our electronic records dated from 12/24/2007 to January 02, 2012, his hemoglobin ranged between 16.1-16.8 He has chronic headaches which he attributed to sinus congestion and frequent intermittent leg cramps and occasional chest discomfort He is undergoing aggressive evaluation  including coronary calcium score with CT imaging next month His blood pressure medication was recently increased because his systolic blood pressure average around 185 with diastolic around 90 He never suffer from diagnosis of blood clot.  There is no prior diagnosis of obstructive sleep apnea, although his wife did say that he might snore. The patient denies weight loss or skin itching. He denies smoking He denies significant history of pulmonary disease The patient has chronic alcohol use, 3 beers per day for the last 40 years He was treated for hepatitis C which he might have contracted from blood transfusion when he was 71 years old He was discharged from GI service after multiple imaging study did not show evidence of liver cirrhosis  Interestingly, he has strong family history of polycythemia vera in his mother and his maternal uncle His mother is deceased With his permission, I reviewed her electronic records and confirmed that she has JAK2 mutation positive polycythemia vera On 07/21/2020, I repeated blood work which show hemoglobin of 17.3.  Serum erythropoietin level was normal.  LDH was normal.  Peripheral blood for JAK2 mutation and MPD panel were within normal range Over the last few years, his hemoglobin improved back to normal with lifestyle changes.  He is being observed  PHYSICAL EXAMINATION: ECOG PERFORMANCE STATUS: 0 - Asymptomatic  Vitals:   11/04/21 0759  BP: (!) 150/72  Pulse: 65  Resp: 18  Temp: 98.1 F (36.7 C)  SpO2: 100%   Filed Weights   11/04/21 0759  Weight: 171 lb 12.8 oz (77.9 kg)    GENERAL:alert, no distress and comfortable NEURO: alert & oriented x 3 with fluent speech, no focal motor/sensory deficits  LABORATORY DATA:  I have reviewed the data as listed    Component Value Date/Time   NA 137 01/02/2012 0926   K 4.6 01/02/2012 0926   CL 102 01/02/2012 0926   CO2 28 01/02/2012 0926   GLUCOSE 91 01/02/2012 0926   BUN 14 01/02/2012 0926    CREATININE 1.0 01/02/2012 0926   CALCIUM 9.1 01/02/2012 0926   PROT 7.2 05/06/2014 0951   ALBUMIN 4.2 05/06/2014 0951   AST 38 (H) 05/06/2014 0951   ALT 39 05/06/2014 0951   ALKPHOS 71 05/06/2014 0951   BILITOT 0.7 05/06/2014 0951   GFRNONAA 70.59 11/26/2009 1429   GFRAA 99 07/07/2008 1503    No results found for: "SPEP", "UPEP"  Lab Results  Component Value Date   WBC 4.5 11/04/2021   NEUTROABS 2.5 11/04/2021   HGB 16.2 11/04/2021   HCT 45.1 11/04/2021   MCV 84.1 11/04/2021   PLT 244 11/04/2021      Chemistry      Component Value Date/Time   NA 137 01/02/2012 0926   K 4.6 01/02/2012 0926   CL 102 01/02/2012 0926   CO2 28 01/02/2012 0926   BUN 14 01/02/2012 0926   CREATININE 1.0 01/02/2012 0926      Component Value Date/Time   CALCIUM 9.1 01/02/2012 0926   ALKPHOS 71 05/06/2014 0951   AST 38 (H) 05/06/2014 0951   ALT 39 05/06/2014 0951   BILITOT 0.7 05/06/2014 0951

## 2021-11-04 NOTE — Assessment & Plan Note (Signed)
He is on multiple antihypertensives We discussed importance of lifestyle modification, weight loss and dietary modification I am hopeful he can eventually come off a few of his blood pressure medications with his continuous effort

## 2021-11-15 ENCOUNTER — Other Ambulatory Visit: Payer: Self-pay | Admitting: Internal Medicine

## 2021-11-15 DIAGNOSIS — R918 Other nonspecific abnormal finding of lung field: Secondary | ICD-10-CM

## 2021-12-14 ENCOUNTER — Ambulatory Visit
Admission: RE | Admit: 2021-12-14 | Discharge: 2021-12-14 | Disposition: A | Discharge status: Home / Self Care | Payer: Medicare Other | Source: Ambulatory Visit | Attending: Internal Medicine | Admitting: Internal Medicine

## 2021-12-14 DIAGNOSIS — R918 Other nonspecific abnormal finding of lung field: Secondary | ICD-10-CM

## 2022-07-20 IMAGING — CT CT CARDIAC CORONARY ARTERY CALCIUM SCORE
3 series · 14 of 20 positions shown, 16 images · non-contrast
Comparison: None.

CLINICAL DATA: Hypertension

EXAM:
CT CARDIAC CORONARY ARTERY CALCIUM SCORE
TECHNIQUE: Non-contrast imaging through the heart was performed using
prospective ECG gating. Image post processing was performed on an
independent workstation, allowing for quantitative analysis of the
heart and coronary arteries. Note that this exam targets the heart
and the chest was not imaged in its entirety.

[Series 2: calcium scoring 2.00 qr36 bestdiast 70% hrt calciu · axial · 0.39mm/px · z∈[+1642,+1726]mm · 4 of 70 slices shown]
[im 14/70  vessel]
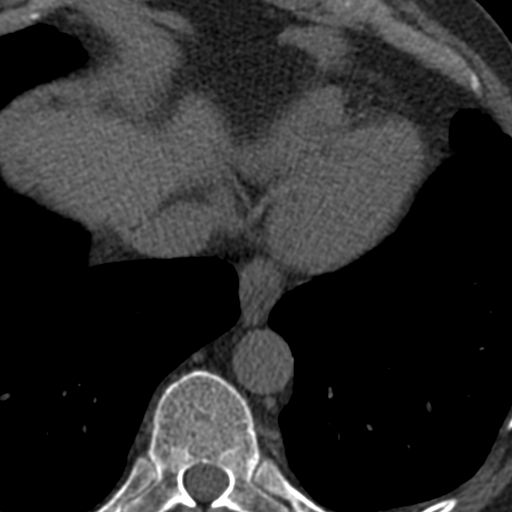
[im 28/70  vessel]
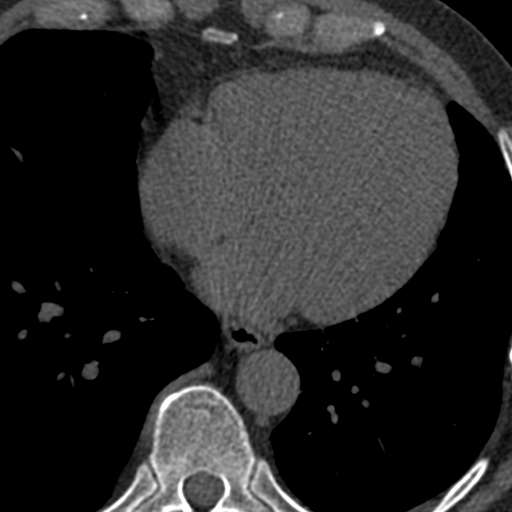
[im 42/70  vessel]
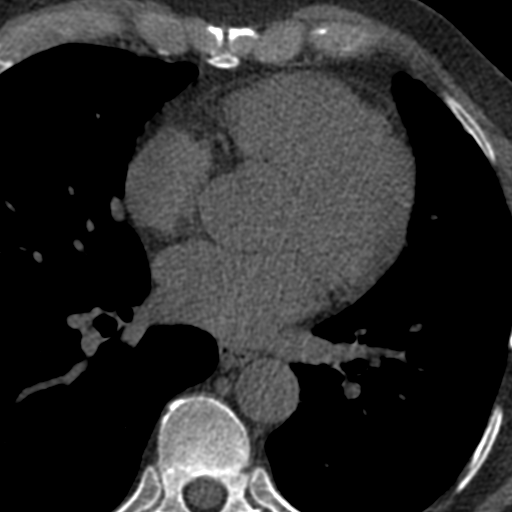
[im 56/70  vessel]
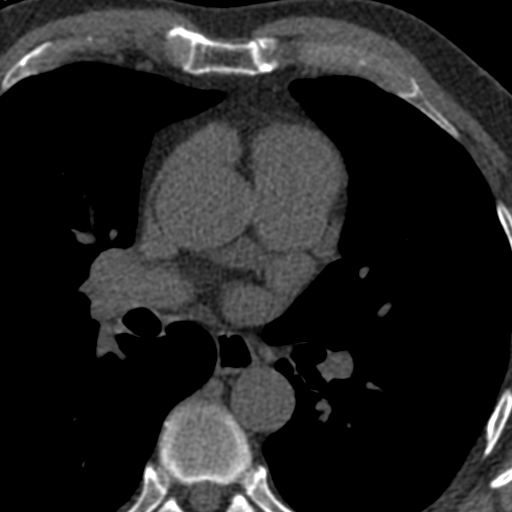

[Series 3: calcium scoring 2.00 br40 bestdiast 70% axial · axial · 0.60mm/px · z∈[+1638,+1730]mm · 5 of 70 slices shown, 7 images]
[im 12/70  vessel]
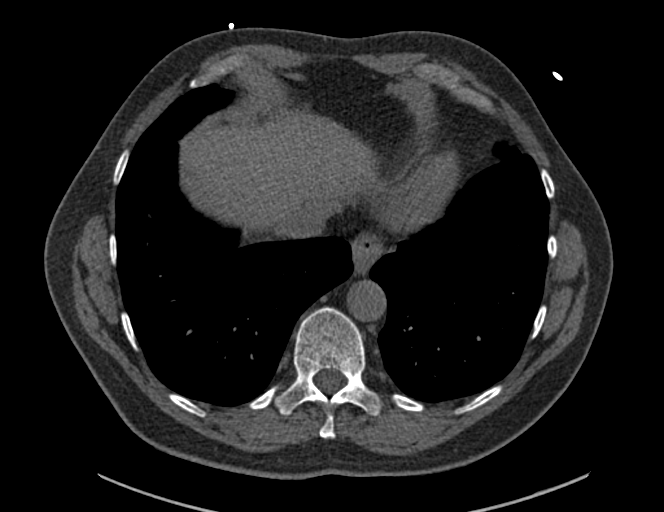
[im 12/70  lung]
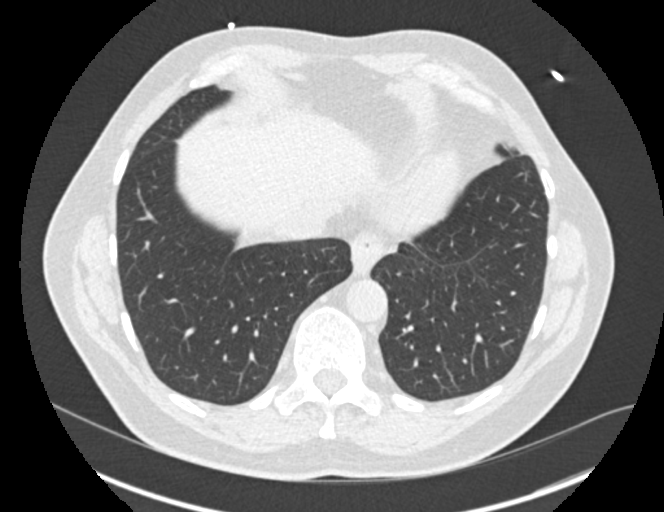
[im 24/70  vessel]
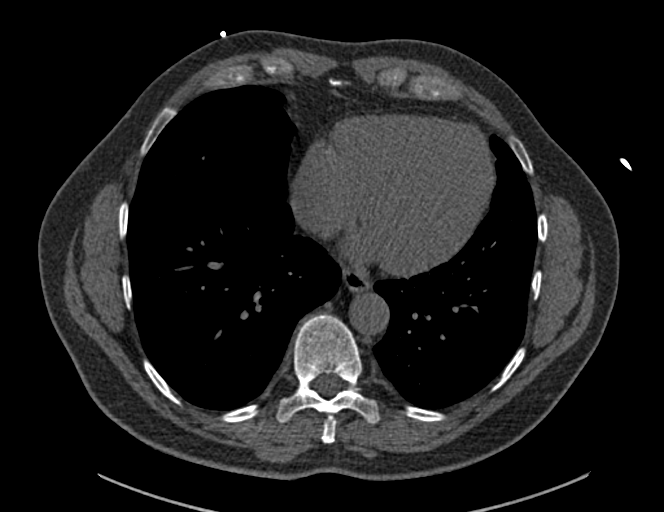
[im 35/70  vessel]
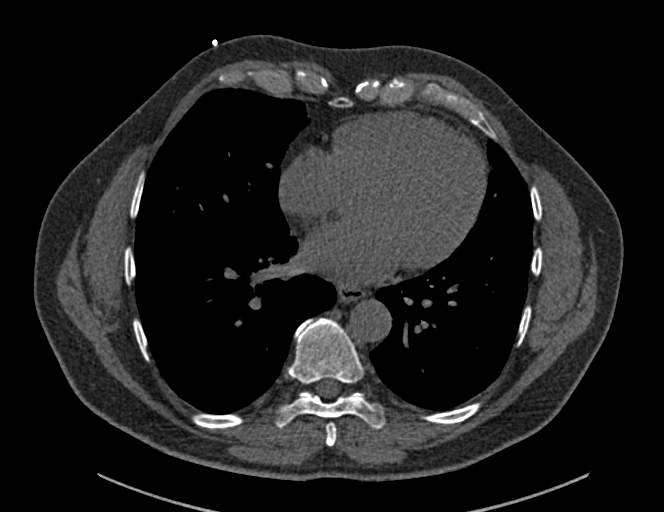
[im 47/70  vessel]
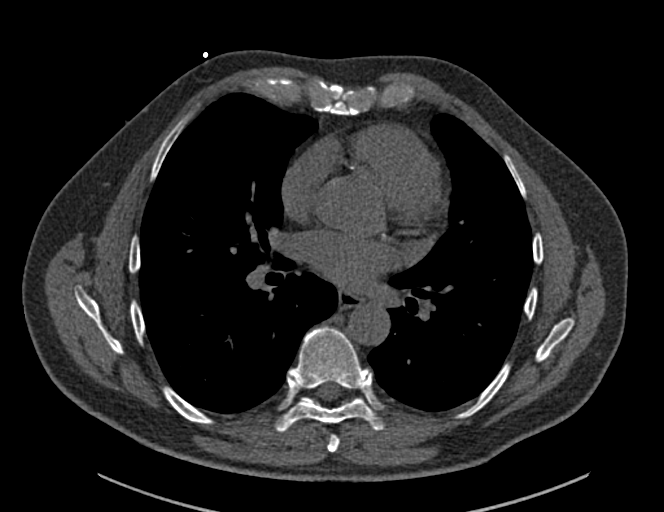
[im 58/70  vessel]
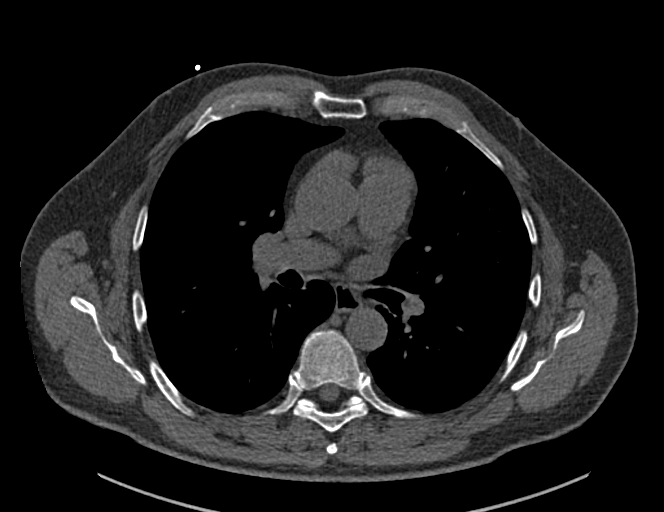
[im 58/70  lung]
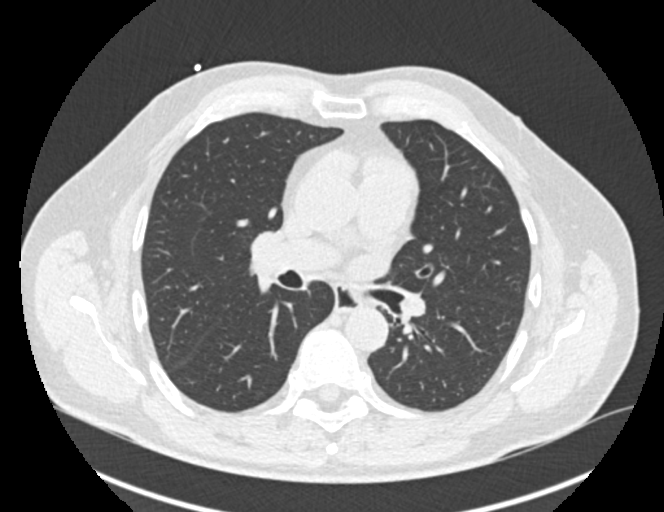

[Series 9: calcium scoring 2.00 br60 bestdiast 70% lungs · axial · 0.60mm/px · z∈[+1638,+1730]mm · 5 of 70 slices shown]
[im 12/70  vessel]
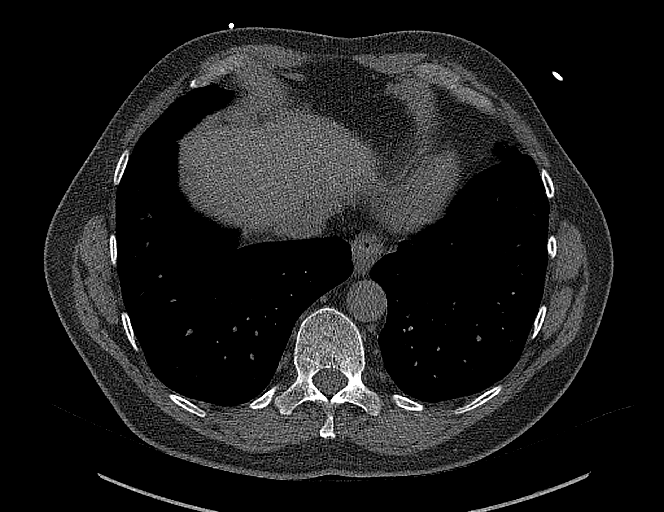
[im 24/70  vessel]
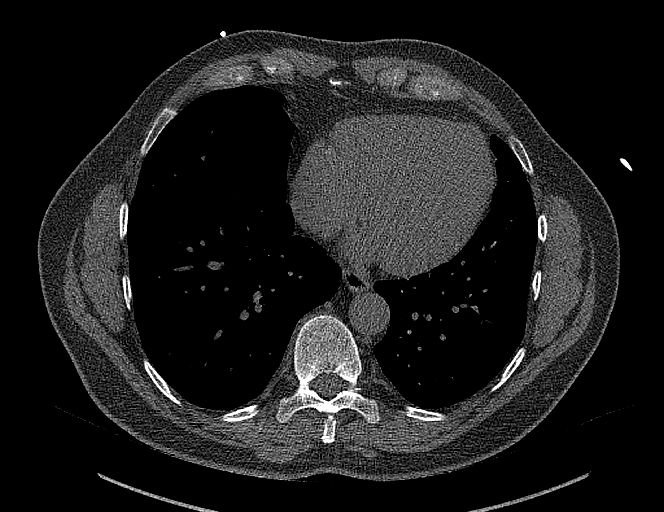
[im 35/70  vessel]
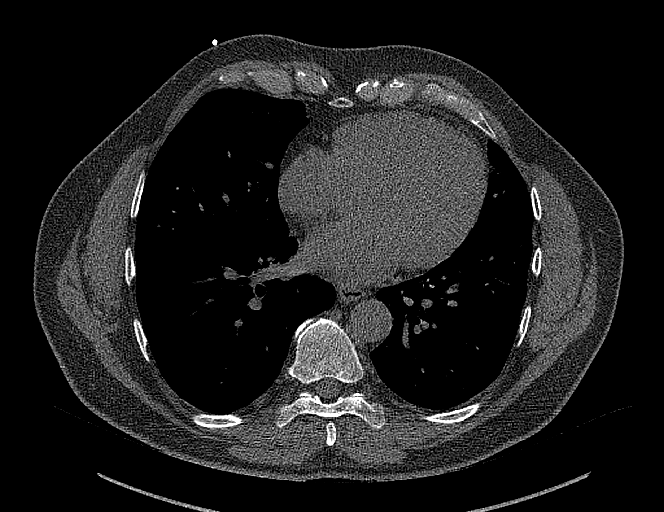
[im 47/70  vessel]
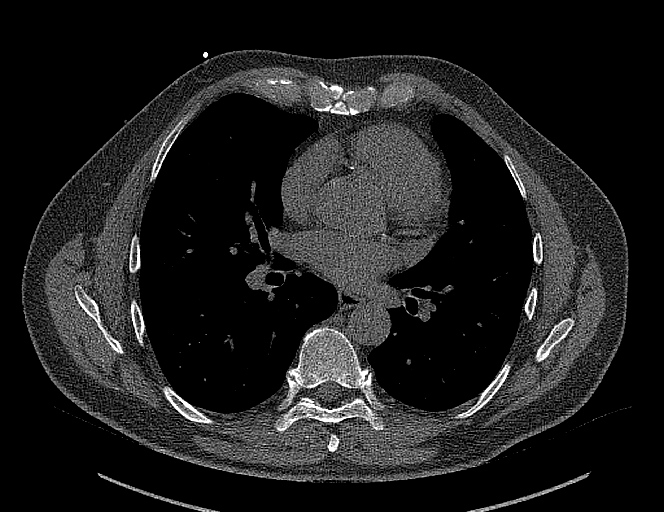
[im 58/70  vessel]
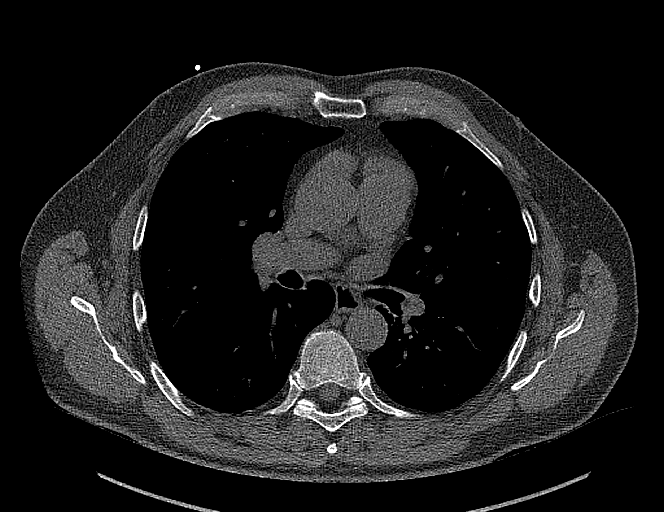

[14 of 20 positions shown; findings below may reference images not displayed]

FINDINGS: CORONARY CALCIUM SCORES:

Left Main: 0

LAD:

LCx: 25

RCA:

Total Agatston Score:

[HOSPITAL] percentile: 33

AORTA MEASUREMENTS:

Ascending Aorta: 38 mm

Descending Aorta: 25 mm

OTHER FINDINGS:

Heart is normal size. Aorta normal caliber. No adenopathy. No
effusions. Left lower lobe nodule measures 4 mm on image 57. No
confluent opacities. Imaging into the upper abdomen demonstrates no
acute findings. Chest wall soft tissues are unremarkable. No acute
bony abnormality.
IMPRESSION: Total Agatston score:

[HOSPITAL] percentile: 33

4 mm left lower lobe pulmonary nodule. No follow-up needed if
patient is low-risk. Non-contrast chest CT can be considered in 12
months if patient is high-risk. This recommendation follows the
consensus statement: Guidelines for Management of Incidental
Pulmonary Nodules Detected on CT Images: From the [HOSPITAL]

## 2022-08-02 NOTE — Progress Notes (Unsigned)
08/03/2022 SANTHIAGO PANEK [id number]:[id number] 02-17-1951  Referring provider: Holland Commons, FNP Primary GI doctor: Dr. Cathleen Corti (Dr. Ardis Hughs)  ASSESSMENT AND PLAN:   Esophageal dysphagia for years, with meats/solids, progressively worsening EGD with dilatation tomorrow at 9AM since there was an opening, patient is not on blood thinners, no heart or lung history, acceptable for [facility] to evaluate for stenosis, tumor, erosive/infectious esophagititis, and EOE.   If the EGD is negative consider barium swallow  Can consider pepcid/PPI trial after EGD I discussed risks of EGD with patient today, including risk of sedation, bleeding or perforation.  Patient provides understanding and gave verbal consent to proceed. Discussed ETOH cessation, GERD lifestyle  Alcoholism ([place]) -Alcohol Abstinence counseling discussed with patient as continued use is strongly associated with worsening liver disease progression  History of Hep C S/p harvoni 2016, low fibrosis score 2017 Gets labs checked with Dr. Shelia Media and normal per patient  Personal history of colonic polyps and family history of colon cancer in father 04/26/2021 colonoscopy Dr. Ardis Hughs per adenomatous polyp history and family history of colon cancer showed 3 mm polyp distal rectum, diverticulosis.  Pathology showed tubular adenoma recall 7 years. (03/2028)  Patient Care Team: [name], MD as PCP - General (Internal Medicine)  HISTORY OF PRESENT ILLNESS: 72 y.o. male with a past medical history of hypertension, chronic hepatitis C status post Harvoni, liver hemangioma, hyperlipidemia, personal history of adenomatous polyps, father with history of colon cancer, and others listed below presents for evaluation of dysphagia.   Chronic hepatitis C liver biopsy 2006 minimally active disease.   2016 Harvoni treatment at [facility] liver clinic complete cure.   06/2015 fibrosis score FO/F1. 11/2013 Colonoscopy Dr. Ardis Hughs: two subcentimeter adenomas  removed, recall recommended in 5 years.  04/26/2021 colonoscopy Dr. Ardis Hughs per adenomatous polyp history and family history of colon cancer showed 3 mm polyp distal rectum, diverticulosis.  Pathology showed tubular adenoma recall 7 years. (03/2028)  Mother with dysphagia.  Remote history of barium swallow 15 + years ago, states had one flap that was weaker than the other, no history of EGD.  He has had dysphagia for 20+ years but getting progressively worse, worse meats/solid foods.  He will have to drink water to "push" it down.  He has some reflux, will take tums as needed, once a week.  He has throat clearing but has allergies.  He is on aleve once a week.  Will have 3 beers daily for as long as he can remember, use to drink more when he was younger.  No smoking, no drug use.  No cervical neck surgery or history of radiation.   He  reports that he has never smoked. He has never used smokeless tobacco. He reports current alcohol use. He reports that he does not use drugs.  RELEVANT LABS AND IMAGING: CBC    Component Value Date/Time   WBC 4.5 11/04/2021 0733   RBC 5.36 11/04/2021 0733   HGB 16.2 11/04/2021 0733   HCT 45.1 11/04/2021 0733   PLT 244 11/04/2021 0733   MCV 84.1 11/04/2021 0733   MCH 30.2 11/04/2021 0733   MCHC 35.9 11/04/2021 0733   RDW 12.2 11/04/2021 0733   LYMPHSABS 1.1 11/04/2021 0733   MONOABS 0.6 11/04/2021 0733   EOSABS 0.2 11/04/2021 0733   BASOSABS 0.1 11/04/2021 0733   Recent Labs    11/04/21 0733  HGB 16.2     CMP     Component Value Date/Time   NA 137  01/02/2012 0926   K 4.6 01/02/2012 0926   CL 102 01/02/2012 0926   CO2 28 01/02/2012 0926   GLUCOSE 91 01/02/2012 0926   BUN 14 01/02/2012 0926   CREATININE 1.0 01/02/2012 0926   CALCIUM 9.1 01/02/2012 0926   PROT 7.2 05/06/2014 0951   ALBUMIN 4.2 05/06/2014 0951   AST 38 (H) 05/06/2014 0951   ALT 39 05/06/2014 0951   ALKPHOS 71 05/06/2014 0951   BILITOT 0.7 05/06/2014 0951   GFRNONAA  70.59 11/26/2009 1429   GFRAA 99 07/07/2008 1503      Latest Ref Rng & Units 05/06/2014    9:51 AM 01/02/2012    9:26 AM 12/28/2010    9:27 AM  Hepatic Function  Total Protein 6.0 - 8.3 g/dL 7.2  7.6  8.1   Albumin 3.5 - 5.2 g/dL 4.2  4.3  4.9   AST 0 - 37 U/L 38  37  32   ALT 0 - 53 U/L 39  49  35   Alk Phosphatase 39 - 117 U/L 71  75  69   Total Bilirubin 0.2 - 1.2 mg/dL 0.7  0.9  0.8   Bilirubin, Direct 0.0 - 0.3 mg/dL 0.1         Latest Ref Rng & Units 12/16/2007    8:53 PM 08/12/2008   10:26 PM  Hepatitis C  Hep C Genotype   1b   HCV Quanitative <43 intl units/mL  2070000   AFP 0.0 - 8.0 ng/mL 1.8      Current Medications:    Current Outpatient Medications (Cardiovascular):    amLODipine (NORVASC) 5 MG tablet, Take 5 mg by mouth daily.   rosuvastatin (CRESTOR) 20 MG tablet, Take 20 mg by mouth daily.   telmisartan-hydrochlorothiazide (MICARDIS HCT) 80-25 MG tablet, Take 1 tablet by mouth daily.   tadalafil (CIALIS) 5 MG tablet, Take 5 mg by mouth 2 (two) times a week.  Current Outpatient Medications (Respiratory):    Cetirizine HCl 10 MG CAPS, Take by mouth daily in the afternoon.  Current Outpatient Medications (Analgesics):    aspirin EC 81 MG tablet, Take 81 mg by mouth daily. Swallow whole.   naproxen sodium (ANAPROX) 220 MG tablet, Take 220 mg by mouth as needed.    Current Outpatient Medications (Hematological):    Cyanocobalamin (VITAMIN B-12) 2500 MCG SUBL, Place 1,000 mcg under the tongue daily.  Current Outpatient Medications (Other):    Cholecalciferol (D3) 50 MCG (2000 UT) TABS, Take 1 tablet by mouth daily in the afternoon.   diclofenac Sodium (VOLTAREN) 1 % GEL, See admin instructions.  Medical History:  Past Medical History:  Diagnosis Date   Alcohol abuse, unspecified    Allergy    Anxiety    Arthritis    Blood transfusion without reported diagnosis 1959   as child   Chest pain    Chronic hepatitis C without mention of hepatic coma     Hemangioma of intra-abdominal structures    Hepatitis C    Hyperlipidemia    Hypertension    Insomnia, unspecified    Personal history of colonic polyps    Allergies:  Allergies  Allergen Reactions   Chlorthalidone     Other reaction(s): hyponatremia   Codeine     Causes anxiety   Penicillins     Per pt: as child; unknown reaction     Surgical History:  He  has a past surgical history that includes bilateral inguinal hernia (2000); Umbilical hernia repair (2003); Mandible  surgery (1985); and Nasal sinus surgery. Family History:  His family history includes Cancer in his maternal grandmother; Colon cancer (age of onset: 49) in his father; Parkinson's disease in his father; Polycythemia in his mother and paternal uncle; Stroke in his mother.  REVIEW OF SYSTEMS  : All other systems reviewed and negative except where noted in the History of Present Illness.  PHYSICAL EXAM: BP (!) 142/78   Pulse 66   Ht '5\' 9"'$  (1.753 m)   Wt 173 lb (78.5 kg)   SpO2 98%   BMI 25.55 kg/m  General Appearance: Well nourished, in no apparent distress. Head:   Normocephalic and atraumatic. Eyes:  sclerae anicteric,conjunctive pink  Respiratory: Respiratory effort normal, BS equal bilaterally without rales, rhonchi, wheezing. Cardio: RRR with no MRGs. Peripheral pulses intact.  Abdomen: Soft,  Obese ,active bowel sounds. No tenderness . No masses. Rectal: Not evaluated Musculoskeletal: Full ROM, Normal gait. Without edema. Skin:  Dry and intact without significant lesions or rashes Neuro: Alert and  oriented x4;  No focal deficits. Psych:  Cooperative. Normal mood and affect.    Vladimir Crofts, PA-C 11:31 AM

## 2022-08-03 ENCOUNTER — Encounter: Payer: Self-pay | Admitting: Physician Assistant

## 2022-08-03 ENCOUNTER — Ambulatory Visit (INDEPENDENT_AMBULATORY_CARE_PROVIDER_SITE_OTHER): Payer: Medicare Other | Admitting: Physician Assistant

## 2022-08-03 VITALS — BP 142/78 | HR 66 | Ht 69.0 in | Wt 173.0 lb

## 2022-08-03 DIAGNOSIS — Z800 Family history of malignant neoplasm of digestive organs: Secondary | ICD-10-CM | POA: Diagnosis not present

## 2022-08-03 DIAGNOSIS — F1020 Alcohol dependence, uncomplicated: Secondary | ICD-10-CM | POA: Diagnosis not present

## 2022-08-03 DIAGNOSIS — R1319 Other dysphagia: Secondary | ICD-10-CM

## 2022-08-03 DIAGNOSIS — Z86010 Personal history of colonic polyps: Secondary | ICD-10-CM | POA: Diagnosis not present

## 2022-08-03 NOTE — Patient Instructions (Addendum)
You have been scheduled for an endoscopy. Please follow written instructions given to you at your visit today. If you use inhalers (even only as needed), please bring them with you on the day of your procedure.   Dysphagia precautions:  1. Can take over the counter pepcid daily if you want 2. Begin meals with warm beverage 3. Eat smaller more frequent meals 4. Eat slowly, taking small bites and sips 5. Alternate solids and liquids 6. Avoid foods/liquids that increase acid production 7. Sit upright during and for 30+ minutes after meals to facilitate esophageal clearing 8. All meats should be chopped finely.   If something gets hung in your esophagus and will not come up or go down, proceed to the emergency room.    _______________________________________________________  If your blood pressure at your visit was 140/90 or greater, please contact your primary care physician to follow up on this.  _______________________________________________________  If you are age 99 or older, your body mass index should be between 23-30. Your Body mass index is 25.55 kg/m. If this is out of the aforementioned range listed, please consider follow up with your Primary Care Provider.  If you are age 95 or younger, your body mass index should be between 19-25. Your Body mass index is 25.55 kg/m. If this is out of the aformentioned range listed, please consider follow up with your Primary Care Provider.   ________________________________________________________  The [facility] providers would like to encourage you to use [facility] to communicate with providers for non-urgent requests or questions.  Due to long hold times on the telephone, sending your provider a message by [facility] may be a faster and more efficient way to get a response.  Please allow 48 business hours for a response.  Please remember that this is for non-urgent requests.   Due to recent changes in healthcare laws, you may see the results  of your imaging and laboratory studies on MyChart before your provider has had a chance to review them.  We understand that in some cases there may be results that are confusing or concerning to you. Not all laboratory results come back in the same time frame and the provider may be waiting for multiple results in order to interpret others.  Please give [place] 48 hours in order for your provider to thoroughly review all the results before contacting the office for clarification of your results.     Thank you for entrusting me with your care and choosing [facility].  Dr Ardis Hughs

## 2022-08-03 NOTE — Progress Notes (Signed)
Agree with the assessment and plan as outlined by Vicie Mutters, PA-C.  Agree with plan for expedited EGD tomorrow for diagnostic and therapeutic intent.  Krysta Bloomfield, DO, [facility]

## 2022-08-04 ENCOUNTER — Encounter: Payer: Self-pay | Admitting: Gastroenterology

## 2022-08-04 ENCOUNTER — Ambulatory Visit (AMBULATORY_SURGERY_CENTER): Payer: Medicare Other | Admitting: Gastroenterology

## 2022-08-04 VITALS — BP 103/58 | HR 51 | Temp 98.6°F | Resp 12 | Ht 69.0 in | Wt 173.0 lb

## 2022-08-04 DIAGNOSIS — K2100 Gastro-esophageal reflux disease with esophagitis, without bleeding: Secondary | ICD-10-CM

## 2022-08-04 DIAGNOSIS — R1319 Other dysphagia: Secondary | ICD-10-CM

## 2022-08-04 DIAGNOSIS — K449 Diaphragmatic hernia without obstruction or gangrene: Secondary | ICD-10-CM

## 2022-08-04 DIAGNOSIS — K219 Gastro-esophageal reflux disease without esophagitis: Secondary | ICD-10-CM | POA: Diagnosis not present

## 2022-08-04 DIAGNOSIS — K2289 Other specified disease of esophagus: Secondary | ICD-10-CM | POA: Diagnosis not present

## 2022-08-04 DIAGNOSIS — K222 Esophageal obstruction: Secondary | ICD-10-CM | POA: Diagnosis not present

## 2022-08-04 MED ORDER — PANTOPRAZOLE SODIUM 40 MG PO TBEC: 40.0000 mg | DELAYED_RELEASE_TABLET | Freq: Two times a day (BID) | ORAL | 4 refills | Status: DC

## 2022-08-04 MED ORDER — SODIUM CHLORIDE 0.9 % IV SOLN: 500.0000 mL | Freq: Once | INTRAVENOUS | Status: DC

## 2022-08-04 NOTE — Progress Notes (Signed)
Called to room to assist during endoscopic procedure.  Patient ID and intended procedure confirmed with present staff. Received instructions for my participation in the procedure from the performing physician.  

## 2022-08-04 NOTE — Patient Instructions (Signed)
Discharge instructions given. Prescription sent to pharmacy. Return office visit scheduled. Handout out on dilatation diet and Hiatal Hernia. Resume previous medications. YOU HAD AN ENDOSCOPIC PROCEDURE TODAY AT [facility]:   Refer to the procedure report that was given to you for any specific questions about what was found during the examination.  If the procedure report does not answer your questions, please call your gastroenterologist to clarify.  If you requested that your care partner not be given the details of your procedure findings, then the procedure report has been included in a sealed envelope for you to review at your convenience later.  YOU SHOULD EXPECT: Some feelings of bloating in the abdomen. Passage of more gas than usual.  Walking can help get rid of the air that was put into your GI tract during the procedure and reduce the bloating. If you had a lower endoscopy (such as a colonoscopy or flexible sigmoidoscopy) you may notice spotting of blood in your stool or on the toilet paper. If you underwent a bowel prep for your procedure, you may not have a normal bowel movement for a few days.  Please Note:  You might notice some irritation and congestion in your nose or some drainage.  This is from the oxygen used during your procedure.  There is no need for concern and it should clear up in a day or so.  SYMPTOMS TO REPORT IMMEDIATELY:  Following lower endoscopy (colonoscopy or flexible sigmoidoscopy):  Excessive amounts of blood in the stool  Significant tenderness or worsening of abdominal pains  Swelling of the abdomen that is new, acute  Fever of 100F or higher   For urgent or emergent issues, a gastroenterologist can be reached at any hour by calling [phone]. Do not use MyChart messaging for urgent concerns.    DIET:  We do recommend a small meal at first, but then you may proceed to your regular diet.  Drink plenty of fluids but you should avoid  alcoholic beverages for 24 hours.  ACTIVITY:  You should plan to take it easy for the rest of today and you should NOT DRIVE or use heavy machinery until tomorrow (because of the sedation medicines used during the test).    FOLLOW UP: Our staff will call the number listed on your records the next business day following your procedure.  We will call around 7:15- 8:00 am to check on you and address any questions or concerns that you may have regarding the information given to you following your procedure. If we do not reach you, we will leave a message.     If any biopsies were taken you will be contacted by phone or by letter within the next 1-3 weeks.  Please call us at [phone] if you have not heard about the biopsies in 3 weeks.    SIGNATURES/CONFIDENTIALITY: You and/or your care partner have signed paperwork which will be entered into your electronic medical record.  These signatures attest to the fact that that the information above on your After Visit Summary has been reviewed and is understood.  Full responsibility of the confidentiality of this discharge information lies with you and/or your care-partner.

## 2022-08-04 NOTE — Progress Notes (Signed)
Report given to PACU, vss 

## 2022-08-04 NOTE — Op Note (Signed)
[place] Patient Name: Kevin Vega Procedure Date: 08/04/2022 9:00 AM MRN: [id number] Endoscopist: Gerrit Heck , MD, [id number] Age: 72 Referring MD:  Date of Birth: 1950/08/21 Gender: Male Account #: [id number] Procedure:                Upper GI endoscopy Indications:              Dysphagia, Suspected esophageal reflux Medicines:                Monitored Anesthesia Care Procedure:                Pre-Anesthesia Assessment:                           - Prior to the procedure, a History and Physical                            was performed, and patient medications and                            allergies were reviewed. The patient's tolerance of                            previous anesthesia was also reviewed. The risks                            and benefits of the procedure and the sedation                            options and risks were discussed with the patient.                            All questions were answered, and informed consent                            was obtained. Prior Anticoagulants: The patient has                            taken no anticoagulant or antiplatelet agents. ASA                            Grade Assessment: II - A patient with mild systemic                            disease. After reviewing the risks and benefits,                            the patient was deemed in satisfactory condition to                            undergo the procedure.                           After obtaining informed consent, the endoscope was  passed under direct vision. Throughout the                            procedure, the patient's blood pressure, pulse, and                            oxygen saturations were monitored continuously. The                            GIF [id number] was introduced through the                            mouth, and advanced to the second part of duodenum.                            The upper GI  endoscopy was accomplished without                            difficulty. The patient tolerated the procedure                            well. Scope In: Scope Out: Findings:                 One benign-appearing, intrinsic moderate stenosis                            was found 41 cm from the incisors. This stenosis                            measured 1 cm (in length). The stenosis was                            traversed. A TTS dilator was passed through the                            scope. Dilation with a 15-16.5-18 mm balloon and an                            18-19-20 mm balloon dilator was performed to 20 mm.                            The dilation site was examined and showed no                            bleeding, mucosal tear or perforation. This was                            then fractured with cold forceps. Estimated blood                            loss was minimal.  LA Grade A (one or more mucosal breaks less than 5                            mm, not extending between tops of 2 mucosal folds)                            esophagitis with no bleeding was found in the lower                            third of the esophagus.                           The upper third of the esophagus and middle third                            of the esophagus were normal. Biopsies were taken                            from the distal and proximal esophagus with a cold                            forceps for histology. Estimated blood loss was                            minimal.                           A 2 cm hiatal hernia was present.                           The entire examined stomach was normal.                           The examined duodenum was normal. Complications:            No immediate complications. Estimated Blood Loss:     Estimated blood loss was minimal. Impression:               - Benign-appearing esophageal stenosis. Dilated                             with 20 mm TTS balloon then fractured with forceps.                           - LA Grade A reflux esophagitis with no bleeding.                           - Normal upper third of esophagus and middle third                            of esophagus. Biopsied.                           - 2 cm hiatal hernia.                           -  Normal stomach.                           - Normal examined duodenum. Recommendation:           - Patient has a contact number available for                            emergencies. The signs and symptoms of potential                            delayed complications were discussed with the                            patient. Return to normal activities tomorrow.                            Written discharge instructions were provided to the                            patient.                           - Soft diet today then advance as tolerated                            tomorrow.                           - Continue present medications.                           - Await pathology results.                           - Use Protonix (pantoprazole) 40 mg PO BID for 6                            weeks, then reduce to 40 mg daily.                           - Follow-up with Sharlette Dense, PA-C in the GI                            clinic at the next available appointment. Gerrit Heck, MD 08/04/2022 9:42:21 AM

## 2022-08-04 NOTE — Progress Notes (Signed)
GASTROENTEROLOGY PROCEDURE H&P NOTE   Primary Care Physician: [name], MD    Reason for Procedure:   Dysphagia, GERD  Plan:    EGD with dilation and/or biopsy  Patient is appropriate for endoscopic procedure(s) in the ambulatory ([place]) setting.  The nature of the procedure, as well as the risks, benefits, and alternatives were carefully and thoroughly reviewed with the patient. Ample time for discussion and questions allowed. The patient understood, was satisfied, and agreed to proceed.     HPI: Kevin Vega is a 72 y.o. male who presents for EGD for evaluation of dysphagia and GERD. No previous EGD.  Patient was most recently seen in the Gastroenterology Clinic on 08/03/2022.  No interval change in medical history since that appointment. Please refer to that note for full details regarding GI history and clinical presentation.   Past Medical History:  Diagnosis Date   Alcohol abuse, unspecified    Allergy    Anxiety    Arthritis    Blood transfusion without reported diagnosis 1959   as child   Chest pain    Chronic hepatitis C without mention of hepatic coma    Hemangioma of intra-abdominal structures    Hepatitis C    Hyperlipidemia    Hypertension    Insomnia, unspecified    Personal history of colonic polyps     Past Surgical History:  Procedure Laterality Date   bilateral inguinal hernia  2000   MANDIBLE SURGERY  1985   NASAL SINUS SURGERY     UMBILICAL HERNIA REPAIR  2003    Prior to Admission medications   Medication Sig Start Date End Date Taking? Authorizing Provider  amLODipine (NORVASC) 5 MG tablet Take 5 mg by mouth daily.   Yes [provider]  aspirin EC 81 MG tablet Take 81 mg by mouth daily. Swallow whole.   Yes [provider]  Cetirizine HCl 10 MG CAPS Take by mouth daily in the afternoon.   Yes [provider]  Cholecalciferol (D3) 50 MCG (2000 UT) TABS Take 1 tablet by mouth daily in the afternoon.   Yes  [provider]  Cyanocobalamin (VITAMIN B-12) 2500 MCG SUBL Place 1,000 mcg under the tongue daily.   Yes [provider]  rosuvastatin (CRESTOR) 20 MG tablet Take 20 mg by mouth daily.   Yes [provider]  tadalafil (CIALIS) 5 MG tablet Take 5 mg by mouth 2 (two) times a week.   Yes [provider]  telmisartan-hydrochlorothiazide (MICARDIS HCT) 80-25 MG tablet Take 1 tablet by mouth daily. 07/06/20  Yes [provider]  diclofenac Sodium (VOLTAREN) 1 % GEL See admin instructions. 02/08/21   [provider]  naproxen sodium (ANAPROX) 220 MG tablet Take 220 mg by mouth as needed.      [provider]    Current Outpatient Medications  Medication Sig Dispense Refill   amLODipine (NORVASC) 5 MG tablet Take 5 mg by mouth daily.     aspirin EC 81 MG tablet Take 81 mg by mouth daily. Swallow whole.     Cetirizine HCl 10 MG CAPS Take by mouth daily in the afternoon.     Cholecalciferol (D3) 50 MCG (2000 UT) TABS Take 1 tablet by mouth daily in the afternoon.     Cyanocobalamin (VITAMIN B-12) 2500 MCG SUBL Place 1,000 mcg under the tongue daily.     rosuvastatin (CRESTOR) 20 MG tablet Take 20 mg by mouth daily.     tadalafil (CIALIS)  5 MG tablet Take 5 mg by mouth 2 (two) times a week.     telmisartan-hydrochlorothiazide (MICARDIS HCT) 80-25 MG tablet Take 1 tablet by mouth daily.     diclofenac Sodium (VOLTAREN) 1 % GEL See admin instructions.     naproxen sodium (ANAPROX) 220 MG tablet Take 220 mg by mouth as needed.       Current Facility-Administered Medications  Medication Dose Route Frequency Provider Last Rate Last Admin   0.9 %  sodium chloride infusion  500 mL Intravenous Once Gearld Kerstein V, DO        Allergies as of 08/04/2022 - Review Complete 08/04/2022  Allergen Reaction Noted   Chlorthalidone  02/08/2021   Codeine  12/12/2013   Penicillins  10/08/2007    Family History  Problem Relation Age of Onset    Stroke Mother    Polycythemia Mother    Colon cancer Father 78   Parkinson's disease Father    Polycythemia Paternal Uncle        polycythemia   Cancer Maternal Grandmother    Colon polyps Neg Hx    Esophageal cancer Neg Hx    Stomach cancer Neg Hx    Rectal cancer Neg Hx    Pancreatic cancer Neg Hx     Social History   Socioeconomic History   Marital status: Married    Spouse name: Not on file   Number of children: 2   Years of education: Not on file   Highest education level: Not on file  Occupational History   Occupation: Games developer  Tobacco Use   Smoking status: Never   Smokeless tobacco: Never  Vaping Use   Vaping Use: Never used  Substance and Sexual Activity   Alcohol use: Yes    Comment: 3 beers daily   Drug use: No   Sexual activity: Not on file  Other Topics Concern   Not on file  Social History Narrative   Not on file   Social Determinants of Health   Financial Resource Strain: Not on file  Food Insecurity: Not on file  Transportation Needs: Not on file  Physical Activity: Not on file  Stress: Not on file  Social Connections: Not on file  Intimate Partner Violence: Not on file    Physical Exam: Vital signs in last 24 hours: '@BP'$  (!) 141/76   Pulse 64   Temp 98.6 F (37 C) (Temporal)   Ht '5\' 9"'$  (1.753 m)   Wt 173 lb (78.5 kg)   SpO2 99%   BMI 25.55 kg/m  GEN: NAD EYE: Sclerae anicteric ENT: MMM CV: Non-tachycardic Pulm: CTA b/l GI: Soft, NT/ND NEURO:  Alert & Oriented x [place], DO [facility]   08/04/2022 9:03 AM

## 2022-08-04 NOTE — Progress Notes (Signed)
Pt's states no medical or surgical changes since previsit or office visit. 

## 2022-08-07 ENCOUNTER — Telehealth: Payer: Self-pay | Admitting: *Deleted

## 2022-08-07 NOTE — Telephone Encounter (Signed)
  Follow up Call-     08/04/2022    8:18 AM 04/26/2021    7:23 AM  Call back number  Post procedure Call Back phone  # [phone]  Permission to leave phone message Yes Yes     Patient questions:  Do you have a fever, pain , or abdominal swelling? No. Pain Score  0 *  Have you tolerated food without any problems? Yes.  Have you been able to return to your normal activities? Yes.    Do you have any questions about your discharge instructions: Diet   No. Medications  No. Follow up visit  No.  Do you have questions or concerns about your Care? No.  Actions: * If pain score is 4 or above: No action needed, pain <4.

## 2022-09-01 NOTE — Progress Notes (Unsigned)
09/01/2022 Delorise JacksonCharles T Mallozzi [id number] 20-Apr-1951  Referring provider: Merri BrunettePharr, Walter, MD Primary GI doctor: Dr. Carloyn Mannerirgliano (Dr. Christella HartiganJacobs)  ASSESSMENT AND PLAN:   Esophageal dysphagia for years, with meats/solids, progressively worsening EGD with dilatation tomorrow at [facility] since there was an opening, patient is not on blood thinners, no heart or lung history, acceptable for [facility] to evaluate for stenosis, tumor, erosive/infectious esophagititis, and EOE.   If the EGD is negative consider barium swallow  Can consider pepcid/PPI trial after EGD I discussed risks of EGD with patient today, including risk of sedation, bleeding or perforation.  Patient provides understanding and gave verbal consent to proceed. Discussed ETOH cessation, GERD lifestyle  Alcoholism (HCC) -Alcohol Abstinence counseling discussed with patient as continued use is strongly associated with worsening liver disease progression  History of Hep C S/p harvoni 2016, low fibrosis score 2017 Gets labs checked with Dr. Renne CriglerPharr and normal per patient  Personal history of colonic polyps and family history of colon cancer in father 04/26/2021 colonoscopy Dr. Christella HartiganJacobs per adenomatous polyp history and family history of colon cancer showed 3 mm polyp distal rectum, diverticulosis.  Pathology showed tubular adenoma recall 7 years. (03/2028)  Patient Care Team: Merri BrunettePharr, Walter, MD as PCP - General (Internal Medicine)  HISTORY OF PRESENT ILLNESS: 72 y.o. male with a past medical history of hypertension, chronic hepatitis C status post Harvoni, liver hemangioma, hyperlipidemia, personal history of adenomatous polyps, father with history of colon cancer, and others listed below presents for evaluation of dysphagia.   Chronic hepatitis C liver biopsy 2006 minimally active disease.   [facility].   06/2015 fibrosis score FO/F1. 11/2013 Colonoscopy Dr. Christella HartiganJacobs: two subcentimeter adenomas removed,  recall recommended in 5 years.  04/26/2021 colonoscopy Dr. Christella HartiganJacobs per adenomatous polyp history and family history of colon cancer showed 3 mm polyp distal rectum, diverticulosis.  Pathology showed tubular adenoma recall 7 years. (03/2028) 08/04/2022 EGD Dr. Barron Alvineirigliano for dysphagia and reflux esophageal stenosis dilated 20 mm, LA grade a reflux esophagitis, normal third of esophagus and middle third of esophagus, 2 cm hiatal hernia, normal stomach normal duodenum. Biopsies show benign reflux changes  Mother with dysphagia.  Remote history of barium swallow 15 + years ago, states had one flap that was weaker than the other, no history of EGD.  He has had dysphagia for 20+ years but getting progressively worse, worse meats/solid foods.  He will have to drink water to "push" it down.  He has some reflux, will take tums as needed, once a week.  He has throat clearing but has allergies.  He is on aleve once a week.  Will have 3 beers daily for as long as he can remember, use to drink more when he was younger.  No smoking, no drug use.  No cervical neck surgery or history of radiation.   He  reports that he has never smoked. He has never used smokeless tobacco. He reports current alcohol use. He reports that he does not use drugs.  RELEVANT LABS AND IMAGING: CBC    Component Value Date/Time   WBC 4.5 11/04/2021 0733   RBC 5.36 11/04/2021 0733   HGB 16.2 11/04/2021 0733   HCT 45.1 11/04/2021 0733   PLT 244 11/04/2021 0733   MCV 84.1 11/04/2021 0733   MCH 30.2 11/04/2021 0733   MCHC 35.9 11/04/2021 0733   RDW 12.2 11/04/2021 0733   LYMPHSABS 1.1 11/04/2021 0733   MONOABS 0.6 11/04/2021 0733  EOSABS 0.2 11/04/2021 0733   BASOSABS 0.1 11/04/2021 0733   Recent Labs    11/04/21 0733  HGB 16.2     CMP     Component Value Date/Time   NA 137 01/02/2012 0926   K 4.6 01/02/2012 0926   CL 102 01/02/2012 0926   CO2 28 01/02/2012 0926   GLUCOSE 91 01/02/2012 0926   BUN 14 01/02/2012  0926   CREATININE 1.0 01/02/2012 0926   CALCIUM 9.1 01/02/2012 0926   PROT 7.2 05/06/2014 0951   ALBUMIN 4.2 05/06/2014 0951   AST 38 (H) 05/06/2014 0951   ALT 39 05/06/2014 0951   ALKPHOS 71 05/06/2014 0951   BILITOT 0.7 05/06/2014 0951   GFRNONAA 70.59 11/26/2009 1429   GFRAA 99 07/07/2008 1503      Latest Ref Rng & Units 05/06/2014    9:51 AM 01/02/2012    9:26 AM 12/28/2010    9:27 AM  Hepatic Function  Total Protein 6.0 - 8.3 g/dL 7.2  7.6  8.1   Albumin 3.5 - 5.2 g/dL 4.2  4.3  4.9   AST 0 - 37 U/L 38  37  32   ALT 0 - 53 U/L 39  49  35   Alk Phosphatase 39 - 117 U/L 71  75  69   Total Bilirubin 0.2 - 1.2 mg/dL 0.7  0.9  0.8   Bilirubin, Direct 0.0 - 0.3 mg/dL 0.1         Latest Ref Rng & Units 12/16/2007    8:53 PM 08/12/2008   10:26 PM  Hepatitis C  Hep C Genotype   1b   HCV Quanitative <43 intl units/mL  2070000   AFP 0.0 - 8.0 ng/mL 1.8      Current Medications:    Current Outpatient Medications (Cardiovascular):    amLODipine (NORVASC) 5 MG tablet, Take 5 mg by mouth daily.   rosuvastatin (CRESTOR) 20 MG tablet, Take 20 mg by mouth daily.   tadalafil (CIALIS) 5 MG tablet, Take 5 mg by mouth 2 (two) times a week.   telmisartan-hydrochlorothiazide (MICARDIS HCT) 80-25 MG tablet, Take 1 tablet by mouth daily.  Current Outpatient Medications (Respiratory):    Cetirizine HCl 10 MG CAPS, Take by mouth daily in the afternoon.  Current Outpatient Medications (Analgesics):    aspirin EC 81 MG tablet, Take 81 mg by mouth daily. Swallow whole.   naproxen sodium (ANAPROX) 220 MG tablet, Take 220 mg by mouth as needed.    Current Outpatient Medications (Hematological):    Cyanocobalamin (VITAMIN B-12) 2500 MCG SUBL, Place 1,000 mcg under the tongue daily.  Current Outpatient Medications (Other):    Cholecalciferol (D3) 50 MCG (2000 UT) TABS, Take 1 tablet by mouth daily in the afternoon.   diclofenac Sodium (VOLTAREN) 1 % GEL, See admin instructions.   pantoprazole  (PROTONIX) 40 MG tablet, Take 1 tablet (40 mg total) by mouth 2 (two) times daily before a meal.  Medical History:  Past Medical History:  Diagnosis Date   Alcohol abuse, unspecified    Allergy    Anxiety    Arthritis    Blood transfusion without reported diagnosis 1959   as child   Chest pain    Chronic hepatitis C without mention of hepatic coma    Hemangioma of intra-abdominal structures    Hepatitis C    Hyperlipidemia    Hypertension    Insomnia, unspecified    Personal history of colonic polyps    Allergies:  Allergies  Allergen Reactions   Chlorthalidone     Other reaction(s): hyponatremia   Codeine     Causes anxiety   Penicillins     Per pt: as child; unknown reaction     Surgical History:  He  has a past surgical history that includes bilateral inguinal hernia (2000); Umbilical hernia repair (2003); Mandible surgery (1985); and Nasal sinus surgery. Family History:  His family history includes Cancer in his maternal grandmother; Colon cancer (age of onset: 5860) in his father; Parkinson's disease in his father; Polycythemia in his mother and paternal uncle; Stroke in his mother.  REVIEW OF SYSTEMS  : All other systems reviewed and negative except where noted in the History of Present Illness.  PHYSICAL EXAM: There were no vitals taken for this visit. General Appearance: Well nourished, in no apparent distress. Head:   Normocephalic and atraumatic. Eyes:  sclerae anicteric,conjunctive pink  Respiratory: Respiratory effort normal, BS equal bilaterally without rales, rhonchi, wheezing. Cardio: RRR with no MRGs. Peripheral pulses intact.  Abdomen: Soft,  Obese ,active bowel sounds. No tenderness . No masses. Rectal: Not evaluated Musculoskeletal: Full ROM, Normal gait. Without edema. Skin:  Dry and intact without significant lesions or rashes Neuro: Alert and  oriented x4;  No focal deficits. Psych:  Cooperative. Normal mood and affect.    Doree AlbeeAmanda R Kissy Cielo,  PA-C 8:27 AM

## 2022-09-06 ENCOUNTER — Ambulatory Visit (INDEPENDENT_AMBULATORY_CARE_PROVIDER_SITE_OTHER): Payer: Medicare Other | Admitting: Physician Assistant

## 2022-09-06 ENCOUNTER — Encounter: Payer: Self-pay | Admitting: Physician Assistant

## 2022-09-06 VITALS — BP 122/60 | HR 64 | Ht 69.0 in | Wt 177.6 lb

## 2022-09-06 DIAGNOSIS — K2100 Gastro-esophageal reflux disease with esophagitis, without bleeding: Secondary | ICD-10-CM | POA: Diagnosis not present

## 2022-09-06 DIAGNOSIS — K222 Esophageal obstruction: Secondary | ICD-10-CM | POA: Diagnosis not present

## 2022-09-06 DIAGNOSIS — Z86010 Personal history of colonic polyps: Secondary | ICD-10-CM | POA: Diagnosis not present

## 2022-09-06 DIAGNOSIS — Z8619 Personal history of other infectious and parasitic diseases: Secondary | ICD-10-CM

## 2022-09-06 DIAGNOSIS — K449 Diaphragmatic hernia without obstruction or gangrene: Secondary | ICD-10-CM | POA: Diagnosis not present

## 2022-09-06 NOTE — Patient Instructions (Addendum)
Please take your proton pump inhibitor medication, twice a day for 8 weeks.  Then can go to once a day after that.   Please take this medication 30 minutes to 1 hour before meals- this makes it more effective.  Avoid spicy and acidic foods Avoid fatty foods Limit your intake of coffee, tea, alcohol, and carbonated drinks Work to maintain a healthy weight Keep the head of the bed elevated at least 3 inches with blocks or a wedge pillow if you are having any nighttime symptoms Stay upright for 2 hours after eating Avoid meals and snacks three to four hours before bedtime   Make sure calculating Fib 4 score with PCP and or getting AB [place] if elevated LFTs/low platelets.

## 2022-09-07 NOTE — Progress Notes (Signed)
Agree with the assessment and plan as outlined by Amanda Collier, PA-C. ? ?Lyla Jasek, DO, FACG ? ?

## 2022-09-25 ENCOUNTER — Telehealth: Payer: Self-pay | Admitting: Physician Assistant

## 2022-09-25 NOTE — Telephone Encounter (Signed)
Called patient was able to discuss with him. He has been having 1 looser stool with some increased gas for the last several weeks. Denies any hematochezia, abdominal pain, fevers chills, nausea.  States his reflux is well-controlled.  Possibly disruption of flora, will cut back on the Protonix from 40 mg twice daily back to once daily add on Florastor or align for 1 month and patient will contact me back and let me know how he is doing.  Did discuss with the patient if he has more than 3 stools a day or any new symptoms to contact me sooner.

## 2022-09-25 NOTE — Telephone Encounter (Signed)
Pt states he had an EGD done and had a hiatal hernia and was started on protonix 40mg  BID. He was to take it for 6 weeks then start daily. He reports he is having loose stools, upset stomach with some abd cramping. He also read where this medication can cause stomach polyps that can be cancerous. Pt wanted to know if the protonix could be causing these issues. If so possibly change to a different med. Please advise.

## 2022-09-25 NOTE — Telephone Encounter (Signed)
PT is calling about pantoprazole. It is giving him stomach issues such as soft stools, gas and discomfort. He is wondering if he should continue taking it. Please advise.

## 2022-10-25 DIAGNOSIS — K2100 Gastro-esophageal reflux disease with esophagitis, without bleeding: Secondary | ICD-10-CM

## 2022-10-25 DIAGNOSIS — A09 Infectious gastroenteritis and colitis, unspecified: Secondary | ICD-10-CM

## 2022-10-27 ENCOUNTER — Other Ambulatory Visit: Payer: Self-pay

## 2022-10-27 ENCOUNTER — Other Ambulatory Visit: Payer: Self-pay | Admitting: Physician Assistant

## 2022-10-27 MED ORDER — PANTOPRAZOLE SODIUM 40 MG PO TBEC: 40.0000 mg | DELAYED_RELEASE_TABLET | Freq: Every day | ORAL | 1 refills | Status: DC

## 2022-11-15 NOTE — Telephone Encounter (Signed)
He just had that endoscopy 07/2022 which did not show anything concerning, did show just plain gastritis and grade A esophagitis Last abdominal ultrasound was 2017, gallbladder was normal then.  I can repeat abdominal ultrasound to evaluate for gallbladder issues that can contribute.  Otherwise this is likely a gastritis or GERD.  Please make sure that you stop any alcohol as that can increase reflux and cause her symptoms. Please make sure you are not taking any NSAIDs like Aleve, ibuprofen or Goody powders. There is also something called a TIF procedure that I can discuss with Dr. Carloyn Manner if you do not want to continue on the medications otherwise if your symptoms are controlled with medications that is what we do.   I will also get some stool tests on you to evaluate for other things that could cause loose stool other then the protonix.  Please go to [facility] office at [address] [place], [place] [address] between the hours of 7:30 am and 4:00 pm to get stool studies. Our lab is located in the basement of the building. My nurse will set up an AB [place] with you. If all of this is normal, can consider small intestinal bacterial overgrowth evaluation.

## 2022-11-20 ENCOUNTER — Other Ambulatory Visit: Payer: Medicare Other

## 2022-11-21 ENCOUNTER — Ambulatory Visit: Payer: Medicare Other

## 2022-11-21 DIAGNOSIS — A09 Infectious gastroenteritis and colitis, unspecified: Secondary | ICD-10-CM

## 2022-11-21 DIAGNOSIS — K2100 Gastro-esophageal reflux disease with esophagitis, without bleeding: Secondary | ICD-10-CM

## 2022-11-22 LAB — CLOSTRIDIUM DIFFICILE TOXIN B, QUALITATIVE, REAL-TIME PCR: Toxigenic C. Difficile by PCR: NOT DETECTED

## 2022-11-23 LAB — STOOL CULTURE: E coli, Shiga toxin Assay: NEGATIVE

## 2022-11-24 ENCOUNTER — Ambulatory Visit (HOSPITAL_COMMUNITY)
Admission: RE | Admit: 2022-11-24 | Discharge: 2022-11-24 | Disposition: A | Discharge status: Home / Self Care | Payer: Medicare Other | Source: Ambulatory Visit | Attending: Physician Assistant | Admitting: Physician Assistant

## 2022-11-24 DIAGNOSIS — K2100 Gastro-esophageal reflux disease with esophagitis, without bleeding: Secondary | ICD-10-CM | POA: Diagnosis present

## 2022-11-24 LAB — STOOL CULTURE

## 2022-11-25 LAB — STOOL CULTURE

## 2022-11-29 LAB — PANCREATIC ELASTASE, FECAL: Pancreatic Elastase-1, Stool: >500 mcg/g

## 2022-12-12 MED ORDER — LANSOPRAZOLE 30 MG PO CPDR
30.0000 mg | DELAYED_RELEASE_CAPSULE | Freq: Every day | ORAL | 3 refills | Status: DC
Start: 2022-12-12 — End: 2023-02-16

## 2022-12-12 NOTE — Telephone Encounter (Signed)
Patient seen 4/10 in the office for GERD, hiatal hernia. 04/26/2021 colonoscopy 3 mm TA polyp diverticulosis recall 03/2028. 08/04/2022 EGD for dysphagia and reflux status post dilation 20 mm, LA grade a 2 cm hiatal hernia biopsy benign reflux changes.  Patient contacted the office complaining of looser stools, gas, since taking Protonix.   Started Florastor or align for a month and Protonix cut back from twice daily to once daily. Patient started on fiber supplement. Was stating his symptoms had improved but continued to have looser stools, increased gas. AB [place] 11/24/2022 unremarkable  Reflux symptoms have improved, continues to have some looser stools and gas likely from Protonix, will try to switch to Prevacid 30 mg once daily. Will continue fiber supplement Can try Gas-X/cutting out milk products If not improving can consider CT Has follow-up in September.

## 2023-02-15 NOTE — Progress Notes (Signed)
02/16/2023 ROERT CULLIGAN [id number] July 29, 1950  Referring provider: Merri Brunette, MD Primary GI doctor: Dr. Carloyn Manner (Dr. Christella Hartigan)  ASSESSMENT AND PLAN:   Esophageal stenosis/GERD Lifestyle changes discussed, avoid NSAIDS, ETOH Will do prilosec 20 mg daily with pepcid 40 mg at night Can try the prilosec every other day He continues to have dysphagia, likely GERD dysmotility If this does not help we did discuss possible TIF and I gave the patient information, discussed he would need potential more testing like manometry/PH study, will set up next OV with Dr. Carloyn Manner to discuss further.   Alcoholism (HCC) -Alcohol Abstinence counseling discussed with patient as continued use is strongly associated with worsening liver disease progression  History of Hep C S/p harvoni 2016, low fibrosis score 2017 Gets labs checked with Dr. Renne Crigler Normal platelets AB [place] normal spleen, Liver unremarkable  Personal history of colonic polyps and family history of colon cancer in father 04/26/2021 colonoscopy Dr. Christella Hartigan per adenomatous polyp history and family history of colon cancer showed 3 mm polyp distal rectum, diverticulosis.  Pathology showed tubular adenoma recall 7 years. (03/2028)  Patient Care Team: Merri Brunette, MD as PCP - General (Internal Medicine)  HISTORY OF PRESENT ILLNESS: 72 y.o. male with a past medical history of hypertension, chronic hepatitis C status post Harvoni, liver hemangioma, hyperlipidemia, personal history of adenomatous polyps, father with history of colon cancer, and others listed below presents for evaluation of dysphagia.   Chronic hepatitis C liver biopsy 2006 minimally active disease.   2016 Harvoni treatment at [facility] liver clinic complete cure.   06/2015 fibrosis score FO/F1. Gets labs with his PCP every year and states liver enzymes have been normal.  11/2013 Colonoscopy Dr. Christella Hartigan: two subcentimeter adenomas removed, recall recommended in 5 years.   04/26/2021 colonoscopy Dr. Christella Hartigan per adenomatous polyp history and family history of colon cancer showed 3 mm polyp distal rectum, diverticulosis.  Pathology showed tubular adenoma recall 7 years. (03/2028) 08/04/2022 EGD Dr. Barron Alvine for dysphagia and reflux esophageal stenosis dilated 20 mm, LA grade A reflux esophagitis, normal third of esophagus and middle third of esophagus, 2 cm hiatal hernia, normal stomach normal duodenum. Biopsies show benign reflux changes  Had follow-up in the office with myself 10/24  increased Protonix to twice daily however patient began to have loose stools once in the morning with increased gas.  No hematochezia abdominal pain fever chills nausea.  Cut Protonix back to 40 mg once daily and added Florastor for a month. Patient called again stating he continued to have loose stools and gas, started on Benefiber without help. Patient had negative C. difficile, pancreatic elastase normal, stool culture normal. Abdominal ultrasound without liver issues, normal gallbladder normal pancreas normal spleen.  Patient presents for follow up.  He has been on famotidine 40 mg once in the morning.  It does not work as well as the PPI but he has no issues with gas or loose stools.  He has been having swallowing issues again, had the dilation 07/2022.  He has slow moving rice/pasta, it is daily but intermittent and does not happen every time. He clears his throat a lot.  He denies melena, AB pain.  He is on aleve once a week.  Will have 3 beers daily for as long as he can remember, use to drink more when he was younger.  No smoking, no drug use.   He  reports that he has never smoked. He has never used smokeless tobacco. He reports current  alcohol use. He reports that he does not use drugs.  RELEVANT LABS AND IMAGING: CBC    Component Value Date/Time   WBC 4.5 11/04/2021 0733   RBC 5.36 11/04/2021 0733   HGB 16.2 11/04/2021 0733   HCT 45.1 11/04/2021 0733   PLT 244  11/04/2021 0733   MCV 84.1 11/04/2021 0733   MCH 30.2 11/04/2021 0733   MCHC 35.9 11/04/2021 0733   RDW 12.2 11/04/2021 0733   LYMPHSABS 1.1 11/04/2021 0733   MONOABS 0.6 11/04/2021 0733   EOSABS 0.2 11/04/2021 0733   BASOSABS 0.1 11/04/2021 0733   No results for input(s): "HGB" in the last 8760 hours.    CMP     Component Value Date/Time   NA 137 01/02/2012 0926   K 4.6 01/02/2012 0926   CL 102 01/02/2012 0926   CO2 28 01/02/2012 0926   GLUCOSE 91 01/02/2012 0926   BUN 14 01/02/2012 0926   CREATININE 1.0 01/02/2012 0926   CALCIUM 9.1 01/02/2012 0926   PROT 7.2 05/06/2014 0951   ALBUMIN 4.2 05/06/2014 0951   AST 38 (H) 05/06/2014 0951   ALT 39 05/06/2014 0951   ALKPHOS 71 05/06/2014 0951   BILITOT 0.7 05/06/2014 0951   GFRNONAA 70.59 11/26/2009 1429   GFRAA 99 07/07/2008 1503      Latest Ref Rng & Units 05/06/2014    9:51 AM 01/02/2012    9:26 AM 12/28/2010    9:27 AM  Hepatic Function  Total Protein 6.0 - 8.3 g/dL 7.2  7.6  8.1   Albumin 3.5 - 5.2 g/dL 4.2  4.3  4.9   AST 0 - 37 U/L 38  37  32   ALT 0 - 53 U/L 39  49  35   Alk Phosphatase 39 - 117 U/L 71  75  69   Total Bilirubin 0.2 - 1.2 mg/dL 0.7  0.9  0.8   Bilirubin, Direct 0.0 - 0.3 mg/dL 0.1         Latest Ref Rng & Units 12/16/2007    8:53 PM 08/12/2008   10:26 PM  Hepatitis C  Hep C Genotype   1b   HCV Quanitative <43 intl units/mL  2070000   AFP 0.0 - 8.0 ng/mL 1.8      Current Medications:    Current Outpatient Medications (Cardiovascular):    amLODipine (NORVASC) 5 MG tablet, Take 5 mg by mouth daily.   rosuvastatin (CRESTOR) 20 MG tablet, Take 20 mg by mouth daily.   tadalafil (CIALIS) 5 MG tablet, Take 5 mg by mouth 2 (two) times a week.   telmisartan-hydrochlorothiazide (MICARDIS HCT) 80-25 MG tablet, Take 1 tablet by mouth daily.  Current Outpatient Medications (Respiratory):    Cetirizine HCl 10 MG CAPS, Take by mouth daily in the afternoon.  Current Outpatient Medications (Analgesics):     aspirin EC 81 MG tablet, Take 81 mg by mouth daily. Swallow whole.   naproxen sodium (ANAPROX) 220 MG tablet, Take 220 mg by mouth as needed.    Current Outpatient Medications (Hematological):    Cyanocobalamin (VITAMIN B-12) 2500 MCG SUBL, Place 1,000 mcg under the tongue daily.  Current Outpatient Medications (Other):    Cholecalciferol (D3) 50 MCG (2000 UT) TABS, Take 1 tablet by mouth daily in the afternoon.   diclofenac Sodium (VOLTAREN) 1 % GEL, See admin instructions.   famotidine (PEPCID) 40 MG tablet, Take 40 mg by mouth daily.   omeprazole (PRILOSEC) 20 MG capsule, Take 1 capsule (20 mg total) by  mouth daily before breakfast.  Medical History:  Past Medical History:  Diagnosis Date   Alcohol abuse, unspecified    Allergy    Anxiety    Arthritis    Blood transfusion without reported diagnosis 1959   as child   Chest pain    Chronic hepatitis C without mention of hepatic coma    Hemangioma of intra-abdominal structures    Hepatitis C    Hyperlipidemia    Hypertension    Insomnia, unspecified    Personal history of colonic polyps    Allergies:  Allergies  Allergen Reactions   Chlorthalidone     Other reaction(s): hyponatremia   Codeine     Causes anxiety   Penicillins     Per pt: as child; unknown reaction     Surgical History:  He  has a past surgical history that includes bilateral inguinal hernia (2000); Umbilical hernia repair (2003); Mandible surgery (1985); and Nasal sinus surgery. Family History:  His family history includes Cancer in his maternal grandmother; Colon cancer (age of onset: 102) in his father; Parkinson's disease in his father; Polycythemia in his mother and paternal uncle; Stroke in his mother.  REVIEW OF SYSTEMS  : All other systems reviewed and negative except where noted in the History of Present Illness.  PHYSICAL EXAM: BP 138/86   Pulse 81   Ht 5' 8.5" (1.74 m)   Wt 172 lb (78 kg)   BMI 25.77 kg/m  General Appearance: Well  nourished, in no apparent distress. Head:   Normocephalic and atraumatic. Eyes:  sclerae anicteric,conjunctive pink  Respiratory: Respiratory effort normal, BS equal bilaterally without rales, rhonchi, wheezing. Cardio: RRR with no MRGs. Peripheral pulses intact.  Abdomen: Soft,  Obese ,active bowel sounds. No tenderness . No masses. Rectal: Not evaluated Musculoskeletal: Full ROM, Normal gait. Without edema. Skin:  Dry and intact without significant lesions or rashes Neuro: Alert and  oriented x4;  No focal deficits. Psych:  Cooperative. Normal mood and affect.    Doree Albee, PA-C 9:01 AM

## 2023-02-16 ENCOUNTER — Encounter: Payer: Self-pay | Admitting: Physician Assistant

## 2023-02-16 ENCOUNTER — Ambulatory Visit (INDEPENDENT_AMBULATORY_CARE_PROVIDER_SITE_OTHER): Payer: Medicare Other | Admitting: Physician Assistant

## 2023-02-16 VITALS — BP 138/86 | HR 81 | Ht 68.5 in | Wt 172.0 lb

## 2023-02-16 DIAGNOSIS — Z86010 Personal history of colonic polyps: Secondary | ICD-10-CM

## 2023-02-16 DIAGNOSIS — K2100 Gastro-esophageal reflux disease with esophagitis, without bleeding: Secondary | ICD-10-CM | POA: Diagnosis not present

## 2023-02-16 DIAGNOSIS — Z8619 Personal history of other infectious and parasitic diseases: Secondary | ICD-10-CM

## 2023-02-16 DIAGNOSIS — K222 Esophageal obstruction: Secondary | ICD-10-CM | POA: Diagnosis not present

## 2023-02-16 DIAGNOSIS — F1020 Alcohol dependence, uncomplicated: Secondary | ICD-10-CM

## 2023-02-16 MED ORDER — OMEPRAZOLE 20 MG PO CPDR: 20.0000 mg | DELAYED_RELEASE_CAPSULE | Freq: Every day | ORAL | 3 refills | Status: AC

## 2023-02-16 NOTE — Patient Instructions (Addendum)
Please take your proton pump inhibitor medication, prilosec 20 mg can try daily or every other day WITH the famotidine 40 mg at night.   Please take this prilosec 30 minutes to 1 hour before meals- this makes it more effective.  Avoid spicy and acidic foods Avoid fatty foods Limit your intake of coffee, tea, alcohol, and carbonated drinks Work to maintain a healthy weight Keep the head of the bed elevated at least 3 inches with blocks or a wedge pillow if you are having any nighttime symptoms Stay upright for 2 hours after eating Avoid meals and snacks three to four hours before bedtime  For more information on the TIF procedure please visit the website at [email or web address] If you would like to view an informative video regarding the TIF procedure, please visit:  [email or web address].  _______________________________________________________  If your blood pressure at your visit was 140/90 or greater, please contact your primary care physician to follow up on this.  _______________________________________________________  If you are age 8 or older, your body mass index should be between 23-30. Your Body mass index is 25.77 kg/m. If this is out of the aforementioned range listed, please consider follow up with your Primary Care Provider.  If you are age 29 or younger, your body mass index should be between 19-25. Your Body mass index is 25.77 kg/m. If this is out of the aformentioned range listed, please consider follow up with your Primary Care Provider.   ________________________________________________________  The [facility] providers would like to encourage you to use [facility] to communicate with providers for non-urgent requests or questions.  Due to long hold times on the telephone, sending your provider a message by [facility] may be a faster and more efficient way to get a response.  Please allow 48 business hours for a response.  Please remember that this is for non-urgent  requests.  _______________________________________________________   I appreciate the  opportunity to care for you  Thank You   [facility]

## 2023-02-19 NOTE — Progress Notes (Signed)
Agree with the assessment and plan as outlined by Amanda Collier, PA-C. ? ?Corgan Mormile, DO, FACG ? ?

## 2023-05-31 ENCOUNTER — Ambulatory Visit: Payer: Medicare Other | Admitting: Gastroenterology

## 2023-05-31 ENCOUNTER — Encounter: Payer: Self-pay | Admitting: Gastroenterology

## 2023-05-31 VITALS — BP 144/72 | HR 86 | Ht 69.0 in | Wt 179.2 lb

## 2023-05-31 DIAGNOSIS — Z860100 Personal history of colon polyps, unspecified: Secondary | ICD-10-CM

## 2023-05-31 DIAGNOSIS — Z800 Family history of malignant neoplasm of digestive organs: Secondary | ICD-10-CM | POA: Diagnosis not present

## 2023-05-31 DIAGNOSIS — K2100 Gastro-esophageal reflux disease with esophagitis, without bleeding: Secondary | ICD-10-CM | POA: Diagnosis not present

## 2023-05-31 DIAGNOSIS — K449 Diaphragmatic hernia without obstruction or gangrene: Secondary | ICD-10-CM | POA: Diagnosis not present

## 2023-05-31 NOTE — Progress Notes (Signed)
 Chief Complaint:    GERD  GI History: 73 year old male with a history of HTN, chronic hepatitis C status post Harvoni, liver hemangioma, hyperlipidemia, personal history of adenomatous polyps, father with history of colon cancer, follows in the GI clinic for the following:  1) History of colon polyps, Family history of colon cancer (father): - 11/2013 Colonoscopy Dr. Teressa: two subcentimeter adenomas removed, recall recommended in 5 years.  -04/26/2021 colonoscopy Dr. Teressa per adenomatous polyp history and family history of colon cancer showed 3 mm polyp distal rectum, diverticulosis.  Pathology showed tubular adenoma recall 7 years. (03/2028)  2) GERD complicated by peptic stricture, erosive esophagitis, and hiatal hernia.  Reflux generally controlled with Protonix  40 mg twice daily, but complicated by loose stools and increased gas.  Reduced to 40 mg daily and added Florastor in 2024.  Added Pepcid 40 mg every morning in 2024, but not as efficacious as PPI and no change with gas/loose stools -08/04/2022 EGD Dr. San for dysphagia and reflux esophageal stenosis dilated 20 mm, LA grade A reflux esophagitis (path: Reflux changes), normal third of esophagus and middle third of esophagus, 2 cm hiatal hernia, normal stomach normal duodenum.  Improvement in dysphagia with esophageal dilation.  3) History of hepatitis C: Chronic hepatitis C liver biopsy 2006 minimally active disease.   2016 Harvoni treatment at [facility] liver clinic complete cure.   06/2015 fibrosis score FO/F1.  4) Loose stools - 10/2022: Normal pancreatic fecal elastase and negative infectious panel   HPI:     [name] presenting to the Gastroenterology Clinic for follow-up.  Was last seen by Alan Coombs on 02/19/2023.  Main issue at that time was loose stools and gas.  Not much improvement with Benefiber.  Was having breakthrough reflux symptoms and trialed Prilosec 20 mg daily along with continued Pepcid  40 mg nightly.  Was concerned about possible PPI ADR of loose stools and recommended follow-up with me to discuss antireflux surgical options.  He reports combination of omeprazole  20 mg every morning and famotidine 40 mg at night was working better for his reflux.  He has since stopped the famotidine and still well controlled. Gas and loose stools much improved. Dysphagia has also essentially resolved with medication adjustment.  He states he otherwise feels well today and no active concerns or issues.  Review of systems:     No chest pain, no SOB, no fevers, no urinary sx   Past Medical History:  Diagnosis Date   Alcohol abuse, unspecified    Allergy    Anxiety    Arthritis    Blood transfusion without reported diagnosis 1959   as child   Chest pain    Chronic hepatitis C without mention of hepatic coma    Hemangioma of intra-abdominal structures    Hepatitis C    Hyperlipidemia    Hypertension    Insomnia, unspecified    Personal history of colonic polyps     Patient's surgical history, family medical history, social history, medications and allergies were all reviewed in Epic    Current Outpatient Medications  Medication Sig Dispense Refill   Cetirizine HCl 10 MG CAPS Take by mouth daily in the afternoon.     Cholecalciferol (D3) 50 MCG (2000 UT) TABS Take 1 tablet by mouth daily in the afternoon.     Cyanocobalamin (VITAMIN B-12) 2500 MCG SUBL Place 1,000 mcg under the tongue daily.     diclofenac Sodium (VOLTAREN) 1 % GEL See admin  instructions.     naproxen sodium (ANAPROX) 220 MG tablet Take 220 mg by mouth as needed.       omeprazole  (PRILOSEC) 20 MG capsule Take 1 capsule (20 mg total) by mouth daily before breakfast. 90 capsule 3   rosuvastatin  (CRESTOR ) 20 MG tablet Take 20 mg by mouth daily.     tadalafil (CIALIS) 5 MG tablet Take 5 mg by mouth 2 (two) times a week.     telmisartan -hydrochlorothiazide  (MICARDIS  HCT) 80-25 MG tablet Take 1 tablet by mouth daily.      No current facility-administered medications for this visit.    Physical Exam:     BP (!) 144/72   Pulse 86   Ht 5' 9 (1.753 m)   Wt 179 lb 4 oz (81.3 kg)   SpO2 98%   BMI 26.47 kg/m   GENERAL:  Pleasant male in NAD PSYCH: : Cooperative, normal affect Musculoskeletal:  Normal muscle tone, normal strength NEURO: Alert and oriented x 3, no focal neurologic deficits   IMPRESSION and PLAN:    1) GERD with erosive esophagitis 2) Hiatal hernia Reflux well-controlled on current therapy. - Resume Prilosec 20 mg every morning - Continue antireflux lifestyle/dietary modifications - Discussed routine annual labs given chronic PPI therapy.  He would like to have these done when he has his follow-up with his PCP and routine labs scheduled to be done at that time.  Will ask to add BMP, B12, folate, vitamin D, iron panel for routine yearly assessment   3) Family history of colon cancer 4) Personal history of colon polyps - Repeat colonoscopy in 03/2028 for ongoing surveillance   RTC in 1 year or sooner as needed      Kevin Vega ,DO, FACG 05/31/2023, 1:42 PM

## 2023-05-31 NOTE — Patient Instructions (Signed)
 Follow up with our office in one year.  _______________________________________________________  If your blood pressure at your visit was 140/90 or greater, please contact your primary care physician to follow up on this.  _______________________________________________________  If you are age 73 or older, your body mass index should be between 23-30. Your Body mass index is 26.47 kg/m. If this is out of the aforementioned range listed, please consider follow up with your Primary Care Provider.  If you are age 70 or younger, your body mass index should be between 19-25. Your Body mass index is 26.47 kg/m. If this is out of the aformentioned range listed, please consider follow up with your Primary Care Provider.   ________________________________________________________  The [facility] providers would like to encourage you to use [facility] to communicate with providers for non-urgent requests or questions.  Due to long hold times on the telephone, sending your provider a message by [facility] may be a faster and more efficient way to get a response.  Please allow 48 business hours for a response.  Please remember that this is for non-urgent requests.  _______________________________________________________

## 2024-02-01 ENCOUNTER — Other Ambulatory Visit: Payer: Self-pay

## 2024-02-01 ENCOUNTER — Emergency Department (HOSPITAL_BASED_OUTPATIENT_CLINIC_OR_DEPARTMENT_OTHER)

## 2024-02-01 ENCOUNTER — Observation Stay (HOSPITAL_BASED_OUTPATIENT_CLINIC_OR_DEPARTMENT_OTHER)
Admission: EM | Admit: 2024-02-01 | Discharge: 2024-02-02 | Disposition: A | Discharge status: Home / Self Care | Source: Ambulatory Visit | Attending: Emergency Medicine | Admitting: Emergency Medicine

## 2024-02-01 DIAGNOSIS — F1010 Alcohol abuse, uncomplicated: Secondary | ICD-10-CM | POA: Diagnosis not present

## 2024-02-01 DIAGNOSIS — K219 Gastro-esophageal reflux disease without esophagitis: Secondary | ICD-10-CM | POA: Insufficient documentation

## 2024-02-01 DIAGNOSIS — Z7982 Long term (current) use of aspirin: Secondary | ICD-10-CM | POA: Diagnosis not present

## 2024-02-01 DIAGNOSIS — R001 Bradycardia, unspecified: Principal | ICD-10-CM | POA: Diagnosis present

## 2024-02-01 DIAGNOSIS — E785 Hyperlipidemia, unspecified: Secondary | ICD-10-CM | POA: Diagnosis present

## 2024-02-01 DIAGNOSIS — Z79899 Other long term (current) drug therapy: Secondary | ICD-10-CM | POA: Diagnosis not present

## 2024-02-01 DIAGNOSIS — R079 Chest pain, unspecified: Secondary | ICD-10-CM | POA: Diagnosis not present

## 2024-02-01 DIAGNOSIS — R0602 Shortness of breath: Secondary | ICD-10-CM | POA: Diagnosis present

## 2024-02-01 DIAGNOSIS — R42 Dizziness and giddiness: Secondary | ICD-10-CM

## 2024-02-01 DIAGNOSIS — I10 Essential (primary) hypertension: Secondary | ICD-10-CM | POA: Diagnosis present

## 2024-02-01 DIAGNOSIS — B182 Chronic viral hepatitis C: Secondary | ICD-10-CM | POA: Diagnosis not present

## 2024-02-01 DIAGNOSIS — I493 Ventricular premature depolarization: Principal | ICD-10-CM | POA: Insufficient documentation

## 2024-02-01 DIAGNOSIS — Z743 Need for continuous supervision: Secondary | ICD-10-CM | POA: Diagnosis not present

## 2024-02-01 LAB — BASIC METABOLIC PANEL WITH GFR
Anion gap: 12 (ref 5–15)
BUN: 17 mg/dL (ref 8–23)
CO2: 23 mmol/L (ref 22–32)
Calcium: 9.6 mg/dL (ref 8.9–10.3)
Chloride: 100 mmol/L (ref 98–111)
Creatinine, Ser: 1.24 mg/dL (ref 0.61–1.24)
GFR, Estimated: >60 mL/min (ref >60)
Glucose, Bld: 103 mg/dL — ABNORMAL HIGH (ref 70–99)
Potassium: 4.4 mmol/L (ref 3.5–5.1)
Sodium: 135 mmol/L (ref 135–145)

## 2024-02-01 LAB — CBC
HCT: 46.1 % (ref 39.0–52.0)
Hemoglobin: 16 g/dL (ref 13.0–17.0)
MCH: 29.5 pg (ref 26.0–34.0)
MCHC: 34.7 g/dL (ref 30.0–36.0)
MCV: 85.1 fL (ref 80.0–100.0)
Platelets: 186 K/uL (ref 150–400)
RBC: 5.42 MIL/uL (ref 4.22–5.81)
RDW: 12.9 % (ref 11.5–15.5)
WBC: 4.7 K/uL (ref 4.0–10.5)
nRBC: 0 % (ref 0.0–0.2)

## 2024-02-01 LAB — TSH: TSH: 3.69 u[IU]/mL (ref 0.350–4.500)

## 2024-02-01 LAB — TROPONIN T, HIGH SENSITIVITY
Troponin T High Sensitivity: <15 ng/L (ref 0–19)
Troponin T High Sensitivity: <15 ng/L (ref 0–19)

## 2024-02-01 LAB — MAGNESIUM: Magnesium: 2.1 mg/dL (ref 1.7–2.4)

## 2024-02-01 MED ORDER — MELATONIN 3 MG PO TABS
3.0000 mg | ORAL_TABLET | Freq: Every evening | ORAL | Status: DC | PRN
  Administered 2024-02-01: 3 mg via ORAL
  Filled 2024-02-01: qty 1

## 2024-02-01 MED ORDER — PANTOPRAZOLE SODIUM 40 MG PO TBEC
40.0000 mg | DELAYED_RELEASE_TABLET | Freq: Every day | ORAL | Status: DC
  Administered 2024-02-01 – 2024-02-02 (×2): 40 mg via ORAL
  Filled 2024-02-01 (×2): qty 1

## 2024-02-01 MED ORDER — TELMISARTAN-HCTZ 80-25 MG PO TABS: 1.0000 | ORAL_TABLET | Freq: Every day | ORAL | Status: DC

## 2024-02-01 MED ORDER — ENOXAPARIN SODIUM 40 MG/0.4ML IJ SOSY
40.0000 mg | PREFILLED_SYRINGE | INTRAMUSCULAR | Status: DC
  Administered 2024-02-01: 40 mg via SUBCUTANEOUS
  Filled 2024-02-01: qty 0.4

## 2024-02-01 MED ORDER — ROSUVASTATIN CALCIUM 20 MG PO TABS
20.0000 mg | ORAL_TABLET | Freq: Every day | ORAL | Status: DC
  Administered 2024-02-02: 20 mg via ORAL
  Filled 2024-02-01: qty 1

## 2024-02-01 MED ORDER — HYDROCHLOROTHIAZIDE 25 MG PO TABS
25.0000 mg | ORAL_TABLET | Freq: Every day | ORAL | Status: DC
  Administered 2024-02-01 – 2024-02-02 (×2): 25 mg via ORAL
  Filled 2024-02-01 (×2): qty 1

## 2024-02-01 MED ORDER — IRBESARTAN 150 MG PO TABS
300.0000 mg | ORAL_TABLET | Freq: Every day | ORAL | Status: DC
  Administered 2024-02-01 – 2024-02-02 (×2): 300 mg via ORAL
  Filled 2024-02-01 (×2): qty 2

## 2024-02-01 NOTE — ED Triage Notes (Signed)
 Pt arrives with chest discomfort and tightness, SOB and dizziness for about a week. Started when lifting some heavy furniture at home and painting with stain and polyurethane sealer.

## 2024-02-01 NOTE — ED Provider Notes (Signed)
 [place] [facility] EMERGENCY DEPARTMENT AT [facility] Provider Note   CSN: [id number] Arrival date & time: 02/01/24  9095     Patient presents with: No chief complaint on file.   Kevin Vega is a 73 y.o. male.   The history is provided by the patient and medical records. No language interpreter was used.    73 year old male with history of alcohol use, chronic hepatitis C, hypertension, GERD presenting with complaints of lightheadedness.  Patient states for the past week he has been experiencing intermittent episodes of lightheadedness for which he describes feeling like he is going to pass out with positional change but also a sense of uneasiness in his chest without any associate chest pain.  He did report living some furniture as well as painting and staining his deck. He's unsure it its a contributor. Symptom has been waxing waning but seems to be more pronounced for the past 2 days.  He does not endorse any significant headache, vision changes, shortness of breath, nausea, vomiting, vertiginous symptoms or focal numbness or focal weakness.  He denies tobacco use but does consume alcohol approximately 3 alcoholic drinks daily.  He denies any recent medication changes no urinary symptoms.  States that he has not had a cardiac stress test in quite a while.  His cardiologist is Dr. Court.  Prior to Admission medications   Medication Sig Start Date End Date Taking? Authorizing Provider  Cetirizine HCl 10 MG CAPS Take by mouth daily in the afternoon.    [provider]  Cholecalciferol (D3) 50 MCG (2000 UT) TABS Take 1 tablet by mouth daily in the afternoon.    [provider]  Cyanocobalamin (VITAMIN B-12) 2500 MCG SUBL Place 1,000 mcg under the tongue daily.    [provider]  diclofenac Sodium (VOLTAREN) 1 % GEL See admin instructions. 02/08/21   [provider]  naproxen sodium (ANAPROX) 220 MG tablet Take 220 mg by mouth as needed.      [provider]  omeprazole  (PRILOSEC) 20 MG capsule Take 1 capsule (20 mg total) by mouth daily before breakfast. 02/16/23   Craig Alan SAUNDERS, PA-C  rosuvastatin  (CRESTOR ) 20 MG tablet Take 20 mg by mouth daily.    [provider]  tadalafil (CIALIS) 5 MG tablet Take 5 mg by mouth 2 (two) times a week.    [provider]  telmisartan -hydrochlorothiazide  (MICARDIS  HCT) 80-25 MG tablet Take 1 tablet by mouth daily. 07/06/20   [provider]    Allergies: Chlorthalidone, Codeine, and Penicillins    Review of Systems  All other systems reviewed and are negative.   Updated Vital Signs BP (!) 164/62   Pulse (!) 42   Temp 98.3 F (36.8 C)   Resp 14   SpO2 99%   Physical Exam Constitutional:      General: He is not in acute distress.    Appearance: He is well-developed.     Comments: Resting comfortably appears to be in no acute discomfort.  HENT:     Head: Atraumatic.  Eyes:     Conjunctiva/sclera: Conjunctivae normal.  Cardiovascular:     Rate and Rhythm: Rhythm irregular.     Pulses: Normal pulses.     Heart sounds: Normal heart sounds.  Pulmonary:     Effort: Pulmonary effort is normal.     Breath sounds: Normal breath sounds. No wheezing, rhonchi or rales.  Abdominal:     Palpations: Abdomen is soft.     Tenderness:  There is no abdominal tenderness.  Musculoskeletal:     Cervical back: Normal range of motion and neck supple.     Right lower leg: No edema.     Left lower leg: No edema.  Skin:    Findings: No rash.  Neurological:     Mental Status: He is alert and oriented to person, place, and time.     (all labs ordered are listed, but only abnormal results are displayed) Labs Reviewed  BASIC METABOLIC PANEL WITH GFR - Abnormal; Notable for the following components:      Result Value   Glucose, Bld 103 (*)    All other components within normal limits  CBC  TSH  TROPONIN T, HIGH SENSITIVITY  TROPONIN T, HIGH SENSITIVITY     EKG: None ED ECG REPORT   Date: 02/01/2024  Rate: 79  Rhythm: normal sinus rhythm  QRS Axis: normal  Intervals: normal  ST/T Wave abnormalities: normal  Conduction Disutrbances:multiple PVCs  Narrative Interpretation:   Old EKG Reviewed: changes noted  I have personally reviewed the EKG tracing and agree with the computerized printout as noted.   Radiology: [facility] Chest Port 1 View Result Date: 02/01/2024 CLINICAL DATA:  Shortness of breath, dizziness and lightheadedness. EXAM: PORTABLE CHEST 1 VIEW COMPARISON:  Chest CT dated 12/14/2021. FINDINGS: Mildly enlarged cardiac silhouette. Mildly prominent pulmonary vasculature. Mild chronic interstitial prominence. No airspace consolidation or pleural fluid. Unremarkable bones. IMPRESSION: 1. Mild cardiomegaly and mild pulmonary vascular congestion. 2. Mild chronic interstitial lung disease. Electronically Signed   By: Elspeth Bathe M.D.   On: 02/01/2024 09:43     Procedures   Medications Ordered in the ED - No data to display                                  Medical Decision Making Amount and/or Complexity of Data Reviewed Labs: ordered. Radiology: ordered.  Risk Decision regarding hospitalization.   BP (!) 205/81 (BP Location: Right Arm)   Pulse (!) 43   Temp 98.3 F (36.8 C)   Resp 17   SpO2 100%   6:73 AM 73 year old male with history of alcohol use, chronic hepatitis C, hypertension, GERD presenting with complaints of lightheadedness.  Patient states for the past week he has been experiencing intermittent episodes of lightheadedness for which he describes feeling like he is going to pass out with positional change but also a sense of uneasiness in his chest without any associate chest pain.  He did report living some furniture as well as painting and staining his deck. He's unsure it its a contributor. Symptom has been waxing waning but seems to be more pronounced for the past 2 days.  He does not endorse any  significant headache, vision changes, shortness of breath, nausea, vomiting, vertiginous symptoms or focal numbness or focal weakness.  He denies tobacco use but does consume alcohol approximately 3 alcoholic drinks daily.  He denies any recent medication changes no urinary symptoms.  States that he has not had a cardiac stress test in quite a while.  His cardiologist is Dr. Court.  On exam patient is resting comfortably appears to be in no acute discomfort.  Exam notable for some bradycardia, EKG monitoring showing evidence of bigeminy with heart rate dipping down to the 40s.  His lungs are clear abdomen is soft nontender no thyromegaly no peripheral edema noted.  Vital signs notable for elevated blood pressure of  205/81 with a heart rate of 43.  Patient had a coronary CT scan that was done in March 2022 with a calcium  score of 42 as well as calcium  noted in all 3 coronary arteries primary circumflex and RCA.  -Labs ordered, independently viewed and interpreted by me.  Labs remarkable for normal TSH, doubt thyroid  disease, negative delta trop doubt MACE, electrolytes are reassuring -The patient was maintained on a cardiac monitor.  I personally viewed and interpreted the cardiac monitored which showed an underlying rhythm of: sinus bradycardia with BiGeminy -Imaging independently viewed and interpreted by me and I agree with radiologist's interpretation.  Result remarkable for CXR shows mild cardiomegaly and mild pulmonary vascular congestion -This patient presents to the ED for concern of lightheadedness, this involves an extensive number of treatment options, and is a complaint that carries with it a high risk of complications and morbidity.  The differential diagnosis includes arrhythmia, anemia, hypovolemia, ACS,  -Co morbidities that complicate the patient evaluation includes alcohol use, HTN, GERD -Treatment includes monitoring -Reevaluation of the patient after these medicines showed that the  patient improved -PCP office notes or outside notes reviewed -Discussion with specialist, on-call cardiologist Dr. Mona and I discussed the above pertinent positive and pertinent negative of patient's presentation including CXR finding along with EKG and troponin.  Cardiology felt if patient has a negative second troponin then patient can follow-up outpatient for further care. Unfortunately, HR remains in the 30s and as pt report symptomatic bradycardia, I appreciate consultation from [facility] DR. Moore who agrees to admit pt for obs -Escalation to admission/observation considered: patient is agreeable with admission      Final diagnoses:  Symptomatic bradycardia    ED Discharge Orders     None          Nivia Colon, PA-C 02/01/24 1414    Mannie Pac T, DO 02/05/24 [phone]

## 2024-02-01 NOTE — ED Notes (Signed)
 Report given to the floor RN.

## 2024-02-01 NOTE — ED Notes (Signed)
 Called [facility] to transport the patient to [facility] rm# [id number]

## 2024-02-01 NOTE — Progress Notes (Signed)
 [facility] night cross cover note:   I was notified by RN of the patient's request for a sleep aid. I subsequently placed order for prn melatonin for insomnia.     Kevin Pigg, DO Hospitalist

## 2024-02-01 NOTE — H&P (Addendum)
 History and Physical    Patient: Kevin Vega FMW:[id number] DOB: 1951/05/04 DOA: 02/01/2024 DOS: the patient was seen and examined on 02/01/2024 PCP: Clarice Nottingham, MD  Patient coming from: Home  Chief Complaint: Lightheadedness Chief Complaint  Patient presents with   Chest Pain   Shortness of Breath   Dizziness   HPI: Kevin Vega is a 73 y.o. male with medical history significant of chronic hepatitis C, hypertension, hyperlipidemia, chronic alcohol abuse who has been having lightheadedness for the past 1 week which became more severe for the past 2 days with associated uneasiness and feeling of some  shortness of breath and therefore came in for further management.  According to patient he initially made a call to his PCP today as he checked his pulse at home and was reading very low. Patient has associated mild shortness of breath but denies chest pain nausea vomiting abdominal pain. According to patient symptoms became more pronounced when he started moving the propane tank at home today.  ED course: Upon arrival to the emergency room patient had blood pressure 160/62, pulse 42, temperature 98.3 saturating 99% on room air. Hospitalist contacted for admission.  Review of Systems: As mentioned in the history of present illness. All other systems reviewed and are negative. Past Medical History:  Diagnosis Date   Alcohol abuse, unspecified    Allergy    Anxiety    Arthritis    Blood transfusion without reported diagnosis 1959   as child   Chest pain    Chronic hepatitis C without mention of hepatic coma    Hemangioma of intra-abdominal structures    Hepatitis C    Hyperlipidemia    Hypertension    Insomnia, unspecified    Personal history of colonic polyps    Past Surgical History:  Procedure Laterality Date   bilateral inguinal hernia  2000   MANDIBLE SURGERY  1985   NASAL SINUS SURGERY     UMBILICAL HERNIA REPAIR  2003   Social History:  reports that he has  never smoked. He has never used smokeless tobacco. He reports current alcohol use. He reports that he does not use drugs.  Allergies  Allergen Reactions   Chlorthalidone     Other reaction(s): hyponatremia   Codeine     Causes anxiety   Penicillins     Per pt: as child; unknown reaction    Family History  Problem Relation Age of Onset   Stroke Mother    Polycythemia Mother    Colon cancer Father 19   Parkinson's disease Father    Polycythemia Paternal Uncle        polycythemia   Cancer Maternal Grandmother    Colon polyps Neg Hx    Esophageal cancer Neg Hx    Stomach cancer Neg Hx    Rectal cancer Neg Hx    Pancreatic cancer Neg Hx     Prior to Admission medications   Medication Sig Start Date End Date Taking? Authorizing Provider  amLODipine (NORVASC) 2.5 MG tablet Take 2.5 mg by mouth daily. 11/05/23  Yes [provider]  olmesartan (BENICAR) 40 MG tablet Take 40 mg by mouth daily. 12/17/23  Yes [provider]  triamcinolone cream (KENALOG) 0.1 % Apply topically. 11/28/23  Yes [provider]  Cetirizine HCl 10 MG CAPS Take by mouth daily in the afternoon.    [provider]  Cholecalciferol (D3) 50 MCG (2000 UT) TABS Take 1 tablet by mouth daily in the afternoon.  [provider]  Cyanocobalamin (VITAMIN B-12) 2500 MCG SUBL Place 1,000 mcg under the tongue daily.    [provider]  diclofenac Sodium (VOLTAREN) 1 % GEL See admin instructions. 02/08/21   [provider]  naproxen sodium (ANAPROX) 220 MG tablet Take 220 mg by mouth as needed.      [provider]  omeprazole  (PRILOSEC) 20 MG capsule Take 1 capsule (20 mg total) by mouth daily before breakfast. 02/16/23   Craig Alan SAUNDERS, PA-C  rosuvastatin  (CRESTOR ) 20 MG tablet Take 20 mg by mouth daily.    [provider]  tadalafil (CIALIS) 5 MG tablet Take 5 mg by mouth 2 (two) times a week.    [provider]   telmisartan -hydrochlorothiazide  (MICARDIS  HCT) 80-25 MG tablet Take 1 tablet by mouth daily. 07/06/20   [provider]    Physical Exam: Vitals:   02/01/24 1600 02/01/24 1700 02/01/24 1730 02/01/24 1817  BP: 129/77 124/72 (!) 141/70 (!) 168/85  Pulse: (!) 56 (!) 42 (!) 37 77  Resp: 15 17 18 17   Temp:    97.7 F (36.5 C)  TempSrc:    Oral  SpO2: 94% 95% 99%   Weight:    76.9 kg  Height:    5' 9 (1.753 m)   General: Elderly male laying in bed in no acute distress CNS: Alert and oriented x 3 moving all extremities CVS: S1-S2 present irregularly irregular rhythm Respiratory: Clear to auscultation bilaterally Abdomen: Obese nontender Musculoskeletal: No abnormality detected Psych: Normal mood  Data Reviewed: I have reviewed patient chest x-ray showing mild cardiomegaly and mild pulmonary vascular congestion    Latest Ref Rng & Units 02/01/2024    9:23 AM 11/04/2021    7:33 AM 11/04/2020    7:26 AM  CBC  WBC 4.0 - 10.5 K/uL 4.7  4.5  5.7   Hemoglobin 13.0 - 17.0 g/dL 83.9  83.7  84.5   Hematocrit 39.0 - 52.0 % 46.1  45.1  43.5   Platelets 150 - 400 K/uL 186  244  231        Latest Ref Rng & Units 02/01/2024    9:23 AM 01/02/2012    9:26 AM 12/28/2010    9:27 AM  BMP  Glucose 70 - 99 mg/dL 896  91  899   BUN 8 - 23 mg/dL 17  14  14    Creatinine 0.61 - 1.24 mg/dL 8.75  1.0  1.1   Sodium 135 - 145 mmol/L 135  137  137   Potassium 3.5 - 5.1 mmol/L 4.4  4.6  4.8   Chloride 98 - 111 mmol/L 100  102  104   CO2 22 - 32 mmol/L 23  28  30    Calcium  8.9 - 10.3 mg/dL 9.6  9.1  9.1     I have reviewed patient's chest x-ray personally as well as above-mentioned labs as noted above.  Assessment and Plan:  Symptomatic bradycardia-improved Heart rate at [facility] trending between 30s to 40s Upon assessment here heart rate has improved to 60s to 70s but now showing new A-fib Case discussed with cardiologist Dr. Vinie Maxcy by myself and he agrees to see patient We  will avoid AV nodal blocking agents Monitor on telemetry closely Echocardiogram requested  Multiple PVCs Continue to monitor on telemetry Discussed with cardiology and no findings of A-fib detected  Chronic hepatitis C According to patient he has been treated  GERD Continue PPI therapy  Essential hypertension  Continue telmisartan  and hydrochlorothiazide   Hyperlipidemia Continue rosuvastatin  Will hold on amlodipine for now  Alcohol abuse We will counsel patient on alcohol cessation  DVT prophylaxis-placed on Lovenox    Advance Care Planning:   Code Status: Not on file full code  Consults: Cardiology  Family Communication: None present at bedside  Severity of Illness: The appropriate patient status for this patient is OBSERVATION. Observation status is judged to be reasonable and necessary in order to provide the required intensity of service to ensure the patient's safety. The patient's presenting symptoms, physical exam findings, and initial radiographic and laboratory data in the context of their medical condition is felt to place them at decreased risk for further clinical deterioration. Furthermore, it is anticipated that the patient will be medically stable for discharge from the hospital within 2 midnights of admission.   Author: Drue ONEIDA Potter, MD 02/01/2024 7:26 PM  For on call review [email or web address].

## 2024-02-01 NOTE — Consult Note (Signed)
 Cardiology Consultation   Patient ID: Kevin Vega MRN: [id number]; DOB: 1951-01-27  Admit date: 02/01/2024 Date of Consult: 02/01/2024  PCP:  Kevin Nottingham, MD   [facility] Providers Cardiologist:  None        Patient Profile: Kevin Vega is a 73 y.o. male with a hx of HTN and HL who is being seen 02/01/2024 for the evaluation of lightheadedness at the request of hospitalist team.  History of Present Illness: Kevin Vega presents with a sensation he calls lightheadedness, but he says it's mostly a sense of uneasiness and mild imbalance.  These are frequently triggered by heavy exertion (such as lifting a heavy item). Some mild associated SOB. This has been going on for some time now, then it got better, but recurred recently.  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  No LOC, no falls, no change in exercise tolerance lately, no edema.    Last echo in 12/2017 showed a structurally normal heart.Treadmill stress in 2019 went 9 min on a standard Bruce protocol producing 10 mets and hit a peak HR of 144.  +EtOH, no tobacco.  No recent change in meds.  Not on chronotropic agents.   In ED, heart rate in 30-40s, here on telemetry, HR in the 60s.  I reviewed all the 12 leads in Epic as well as the telemetry since admission which all showed sinus rhythm with frequent PVCs.  He occasionally has trigeminy or quadrigeminy, and the rare blocked PVC leading to an irregular looking rhythm, but most QRS complexes are preceded by a p-wave     Past Medical History:  Diagnosis Date   Alcohol abuse, unspecified    Allergy    Anxiety    Arthritis    Blood transfusion without reported diagnosis 1959   as child   Chest pain    Chronic hepatitis C without mention of hepatic coma    Hemangioma of intra-abdominal structures    Hepatitis C    Hyperlipidemia    Hypertension    Insomnia, unspecified    Personal history of colonic polyps      Past Surgical History:  Procedure Laterality Date   bilateral inguinal hernia  2000   MANDIBLE SURGERY  1985   NASAL SINUS SURGERY     UMBILICAL HERNIA REPAIR  2003       Scheduled Meds:  enoxaparin  (LOVENOX ) injection  40 mg Subcutaneous Q24H   hydrochlorothiazide   25 mg Oral Daily   irbesartan   300 mg Oral Daily   pantoprazole   40 mg Oral Daily   [START ON 02/02/2024] rosuvastatin   20 mg Oral Daily   Continuous Infusions:  PRN Meds:   Allergies:    Allergies  Allergen Reactions   Chlorthalidone     Other reaction(s): hyponatremia   Codeine     Causes anxiety   Penicillins     Per pt: as child; unknown reaction    Social History:   Social History   Socioeconomic History   Marital status: Married    Spouse name: Not on file   Number of children: 2   Years of education: Not on file   Highest education level: Not on file  Occupational History   Occupation: carpenter  Tobacco Use   Smoking status: Never   Smokeless tobacco: Never  Vaping Use   Vaping status: Never Used  Substance and Sexual Activity   Alcohol use: Yes  Comment: 3 beers daily   Drug use: No   Sexual activity: Not on file  Other Topics Concern   Not on file  Social History Narrative   Not on file   Social Drivers of Health   Financial Resource Strain: Not on file  Food Insecurity: Not on file  Transportation Needs: Not on file  Physical Activity: Not on file  Stress: Not on file  Social Connections: Not on file  Intimate Partner Violence: Not on file    Family History:    Family History  Problem Relation Age of Onset   Stroke Mother    Polycythemia Mother    Colon cancer Father 7   Parkinson's disease Father    Polycythemia Paternal Uncle        polycythemia   Cancer Maternal Grandmother    Colon polyps Neg Hx    Esophageal cancer Neg Hx    Stomach cancer Neg Hx    Rectal cancer Neg Hx    Pancreatic cancer Neg Hx      ROS:  Please see the history of present  illness.   All other ROS reviewed and negative.     Physical Exam/Data: Vitals:   02/01/24 1700 02/01/24 1730 02/01/24 1817 02/01/24 1929  BP: 124/72 (!) 141/70 (!) 168/85 (!) 146/73  Pulse: (!) 42 (!) 37 77 67  Resp: 17 18 17 18   Temp:   97.7 F (36.5 C) 98.2 F (36.8 C)  TempSrc:   Oral Oral  SpO2: 95% 99%  98%  Weight:   76.9 kg   Height:   5' 9 (1.753 m)    No intake or output data in the 24 hours ending 02/01/24 2122    02/01/2024    6:17 PM 05/31/2023    1:32 PM 02/16/2023    8:30 AM  Last 3 Weights  Weight (lbs) 169 lb 9.6 oz 179 lb 4 oz 172 lb  Weight (kg) 76.93 kg 81.307 kg 78.019 kg     Body mass index is 25.05 kg/m.  General:  Well nourished, well developed, in no acute distress HEENT: normal Neck: no JVD Vascular: No carotid bruits; Distal pulses 2+ bilaterally Cardiac:  normal S1, S2; RRR; no murmur  Lungs:  clear to auscultation bilaterally, no wheezing, rhonchi or rales  Abd: soft, nontender, no hepatomegaly  Ext: no edema Musculoskeletal:  No deformities, BUE and BLE strength normal and equal Skin: warm and dry  Neuro:  CNs 2-12 intact, no focal abnormalities noted Psych:  Normal affect    Laboratory Data:  Chemistry Recent Labs  Lab 02/01/24 0923  NA 135  K 4.4  CL 100  CO2 23  GLUCOSE 103*  BUN 17  CREATININE 1.24  CALCIUM  9.6  GFRNONAA >60  ANIONGAP 12   Hematology Recent Labs  Lab 02/01/24 0923  WBC 4.7  RBC 5.42  HGB 16.0  HCT 46.1  MCV 85.1  MCH 29.5  MCHC 34.7  RDW 12.9  PLT 186   Troponin <15 x2  Thyroid  Recent Labs  Lab 02/01/24 0930  TSH 3.690    BNPNo results for input(s): BNP, PROBNP in the last 168 hours.  DDimer No results for input(s): DDIMER in the last 168 hours.  Radiology/Studies:  DG Chest Port 1 View Result Date: 02/01/2024 CLINICAL DATA:  Shortness of breath, dizziness and lightheadedness. EXAM: PORTABLE CHEST 1 VIEW COMPARISON:  Chest CT dated 12/14/2021. FINDINGS: Mildly enlarged cardiac  silhouette. Mildly prominent pulmonary vasculature. Mild chronic interstitial prominence. No  airspace consolidation or pleural fluid. Unremarkable bones. IMPRESSION: 1. Mild cardiomegaly and mild pulmonary vascular congestion. 2. Mild chronic interstitial lung disease. Electronically Signed   By: Elspeth Bathe M.D.   On: 02/01/2024 09:43   Assessment and Plan: Frequent PVCs which telemetry may interpret as bradycardia, but currently no evidence of arrhythmia explaining his symptoms.  His stress test from 2019 also suggested chronotropic competence (ability to mount a heart rate response to exercise) at that time.  He does state some of this may be related to exertion.  Troponins negative.  Recommend - K is ok, will check a Mag level and replete as needed to see if PVC frequency is reduced - no rate controlling agent for now.  Also no need to anticoagulate as have not seen any Afib (previously suspected) - observation overnight, if uneventful, might be able to discharge tomorrow on an event monitor to follow-up with outpatient cardiologist.   Risk Assessment/Risk Scores:              For questions or updates, please contact [facility] Please consult [email or web address] for contact info under    Signed, Andee Flatten, MD  02/01/2024 9:22 PM

## 2024-02-01 NOTE — Plan of Care (Signed)
 Hospital Medicine Transfer Accept Note Patient Name/Age: Kevin Vega / 73 y.o. MRN: [id number] Admission Date: 02/01/2024  Once successfully transferred to the appropriate floor, [facility] will assume care for the patient above.  A/P: 56M h/o HepC, HTN, HLD, and alcohol abuse p/w symptomatic bradycardia. Pt noted to have persistent HR 30-40s and dizziness w/ standing c/w orthostatic hypotension. Accepted to Tele Cards bed, and will needs cards consult on admit.  Marsha Ada, MD Attending Physician Division of [facility] February 01, 2024 2:14 PM

## 2024-02-02 ENCOUNTER — Other Ambulatory Visit: Payer: Self-pay | Admitting: Physician Assistant

## 2024-02-02 ENCOUNTER — Inpatient Hospital Stay (HOSPITAL_BASED_OUTPATIENT_CLINIC_OR_DEPARTMENT_OTHER)

## 2024-02-02 DIAGNOSIS — I493 Ventricular premature depolarization: Secondary | ICD-10-CM | POA: Diagnosis not present

## 2024-02-02 DIAGNOSIS — I4891 Unspecified atrial fibrillation: Secondary | ICD-10-CM | POA: Diagnosis not present

## 2024-02-02 DIAGNOSIS — R42 Dizziness and giddiness: Secondary | ICD-10-CM

## 2024-02-02 LAB — CBC
HCT: 47.9 % (ref 39.0–52.0)
Hemoglobin: 16.6 g/dL (ref 13.0–17.0)
MCH: 30 pg (ref 26.0–34.0)
MCHC: 34.7 g/dL (ref 30.0–36.0)
MCV: 86.6 fL (ref 80.0–100.0)
Platelets: 193 K/uL (ref 150–400)
RBC: 5.53 MIL/uL (ref 4.22–5.81)
RDW: 12.7 % (ref 11.5–15.5)
WBC: 4.9 K/uL (ref 4.0–10.5)
nRBC: 0 % (ref 0.0–0.2)

## 2024-02-02 LAB — ECHOCARDIOGRAM COMPLETE
AR max vel: 2.87 cm2
AV Area VTI: 2.79 cm2
AV Area mean vel: 3.05 cm2
AV Mean grad: 6 mmHg
AV Peak grad: 10.8 mmHg
Ao pk vel: 1.64 m/s
Area-P 1/2: 3.2 cm2
Height: 69 in
S' Lateral: 2.7 cm
Weight: 2713.6 [oz_av]

## 2024-02-02 LAB — BASIC METABOLIC PANEL WITH GFR
Anion gap: 8 (ref 5–15)
BUN: 14 mg/dL (ref 8–23)
CO2: 27 mmol/L (ref 22–32)
Calcium: 9.1 mg/dL (ref 8.9–10.3)
Chloride: 102 mmol/L (ref 98–111)
Creatinine, Ser: 1.29 mg/dL — ABNORMAL HIGH (ref 0.61–1.24)
GFR, Estimated: 59 mL/min — ABNORMAL LOW (ref >60)
Glucose, Bld: 110 mg/dL — ABNORMAL HIGH (ref 70–99)
Potassium: 4.9 mmol/L (ref 3.5–5.1)
Sodium: 137 mmol/L (ref 135–145)

## 2024-02-02 NOTE — Care Management Obs Status (Signed)
 MEDICARE OBSERVATION STATUS NOTIFICATION   Patient Details  Name: Kevin Vega MRN: [id number] Date of Birth: Jul 16, 1950   Medicare Observation Status Notification Given:  Yes    Carletha Spruce, RN 02/02/2024, 11:08 AM

## 2024-02-02 NOTE — Progress Notes (Signed)
  Echocardiogram 2D Echocardiogram has been performed.  Kevin Vega 02/02/2024, 9:05 AM

## 2024-02-02 NOTE — Discharge Summary (Signed)
 Physician Discharge Summary  Kevin Vega FMW:[id number] DOB: 10-05-1950 DOA: 02/01/2024  PCP: Clarice Nottingham, MD  Admit date: 02/01/2024 Discharge date: 02/02/2024  Admitted From: Home Disposition: Home  Recommendations for Outpatient Follow-up:  Follow up with PCP  Follow-up with cardiology, planning on scheduling 7-day ZIO monitor.  Follow-up with Dr. Court   Discharge Condition: Stable CODE STATUS: Full code Diet recommendation: Regular diet  Brief/Interim Summary: From H&P: Kevin Vega is a 73 y.o. male with medical history significant of chronic hepatitis C, hypertension, hyperlipidemia, chronic alcohol abuse who has been having lightheadedness for the past 1 week which became more severe for the past 2 days with associated uneasiness and feeling of some  shortness of breath and therefore came in for further management.  According to patient he initially made a call to his PCP today as he checked his pulse at home and was reading very low. Patient has associated mild shortness of breath but denies chest pain nausea vomiting abdominal pain. According to patient symptoms became more pronounced when he started moving the propane tank at home today.   ED course: Upon arrival to the emergency room patient had blood pressure 160/62, pulse 42, temperature 98.3 saturating 99% on room air. Hospitalist contacted for admission.  Patient was evaluated by cardiology.  Appears that patient has frequent PVCs, no frank bradycardia.  Patient underwent echocardiogram, LV and RV function are normal.  Patient will be set up for outpatient 7-day ZIO monitoring to better quantify PVC burden.  Discharge Diagnoses:   Principal Problem:   Frequent PVCs Active Problems:   Essential hypertension   Hyperlipidemia  Discharge Instructions  Discharge Instructions     Call MD for:  difficulty breathing, headache or visual disturbances   Complete by: As directed    Call MD for:  extreme fatigue    Complete by: As directed    Call MD for:  persistant dizziness or light-headedness   Complete by: As directed    Call MD for:  persistant nausea and vomiting   Complete by: As directed    Call MD for:  severe uncontrolled pain   Complete by: As directed    Call MD for:  temperature >100.4   Complete by: As directed    Diet - low sodium heart healthy   Complete by: As directed    Discharge instructions   Complete by: As directed    You were cared for by a hospitalist during your hospital stay. If you have any questions about your discharge medications or the care you received while you were in the hospital after you are discharged, you can call the unit and ask to speak with the hospitalist on call if the hospitalist that took care of you is not available. Once you are discharged, your primary care physician will handle any further medical issues. Please note that NO REFILLS for any discharge medications will be authorized once you are discharged, as it is imperative that you return to your primary care physician (or establish a relationship with a primary care physician if you do not have one) for your aftercare needs so that they can reassess your need for medications and monitor your lab values.   Increase activity slowly   Complete by: As directed       Allergies as of 02/02/2024       Reactions   Chlorthalidone    Other reaction(s): hyponatremia   Codeine    Causes anxiety   Penicillins  Per pt: as child; unknown reaction        Medication List     TAKE these medications    amLODipine 2.5 MG tablet Commonly known as: NORVASC Take 2.5 mg by mouth daily.   aspirin EC 81 MG tablet Take 81 mg by mouth daily.   Cetirizine HCl 10 MG Caps Take by mouth daily in the afternoon.   D3 50 MCG (2000 UT) Tabs Generic drug: Cholecalciferol Take 1 tablet by mouth daily in the afternoon.   naproxen sodium 220 MG tablet Commonly known as: ALEVE Take 220-440 mg by mouth 2 (two)  times daily as needed (Pain).   olmesartan 40 MG tablet Commonly known as: BENICAR Take 40 mg by mouth daily.   omeprazole  20 MG capsule Commonly known as: PRILOSEC Take 1 capsule (20 mg total) by mouth daily before breakfast.   rosuvastatin  20 MG tablet Commonly known as: CRESTOR  Take 20 mg by mouth daily.   tadalafil 5 MG tablet Commonly known as: CIALIS Take 5 mg by mouth daily.   Vitamin B-12 2500 MCG Subl Place 1,000 mcg under the tongue daily.        Follow-up Information     Clarice Nottingham, MD Follow up.   Specialty: Internal Medicine Contact information: [address] [place] [address] [phone]         Court Dorn PARAS, MD Follow up.   Specialties: Cardiology, Radiology Contact information: [address] [place] [address] [phone]                Allergies  Allergen Reactions   Chlorthalidone     Other reaction(s): hyponatremia   Codeine     Causes anxiety   Penicillins     Per pt: as child; unknown reaction    Consultations: Cardiology   Procedures/Studies: ECHOCARDIOGRAM COMPLETE Result Date: 02/02/2024    ECHOCARDIOGRAM REPORT   Patient Name:   Kevin Vega Date of Exam: 02/02/2024 Medical Rec #:  [id number]        Height:       69.0 in Accession #:    [id number]       Weight:       169.6 lb Date of Birth:  06-02-1950        BSA:          1.926 m Patient Age:    73 years         BP:           126/87 mmHg Patient Gender: M                HR:           71 bpm. Exam Location:  Inpatient Procedure: 2D Echo (Both Spectral and Color Flow Doppler were utilized during            procedure). Indications:    atrial fibrillation  History:        Patient has prior history of Echocardiogram examinations, most                 recent 01/03/2018. Abnormal ECG, Hepatitis C,                 Arrythmias:Bradycardia; Risk Factors:Hypertension and                 Dyslipidemia.  Sonographer:    Tinnie Barefoot RDCS  Referring Phys: [phone] PRINCE T DJAN IMPRESSIONS  1. Chordal SAM noted with mildly elevated LVOT gradient (post PVC  mean 15 mmHg).  2. Left ventricular ejection fraction, by estimation, is 60 to 65%. The left ventricle has normal function. The left ventricle has no regional wall motion abnormalities. There is mild left ventricular hypertrophy of the basal segment. Left ventricular diastolic parameters are consistent with Grade I diastolic dysfunction (impaired relaxation).  3. Right ventricular systolic function is normal. The right ventricular size is normal.  4. The mitral valve is normal in structure. No evidence of mitral valve regurgitation. No evidence of mitral stenosis.  5. The aortic valve is tricuspid. Aortic valve regurgitation is not visualized. No aortic stenosis is present.  6. The inferior vena cava is normal in size with greater than 50% respiratory variability, suggesting right atrial pressure of 3 mmHg. Comparison(s): No prior Echocardiogram. FINDINGS  Left Ventricle: Left ventricular ejection fraction, by estimation, is 60 to 65%. The left ventricle has normal function. The left ventricle has no regional wall motion abnormalities. The left ventricular internal cavity size was normal in size. There is  mild left ventricular hypertrophy of the basal segment. Left ventricular diastolic parameters are consistent with Grade I diastolic dysfunction (impaired relaxation). Right Ventricle: The right ventricular size is normal. Right ventricular systolic function is normal. Left Atrium: Left atrial size was normal in size. Right Atrium: Right atrial size was normal in size. Pericardium: There is no evidence of pericardial effusion. Mitral Valve: The mitral valve is normal in structure. No evidence of mitral valve regurgitation. No evidence of mitral valve stenosis. Tricuspid Valve: The tricuspid valve is normal in structure. Tricuspid valve regurgitation is trivial. No evidence of tricuspid stenosis.  Aortic Valve: The aortic valve is tricuspid. Aortic valve regurgitation is not visualized. No aortic stenosis is present. Aortic valve mean gradient measures 6.0 mmHg. Aortic valve peak gradient measures 10.8 mmHg. Aortic valve area, by VTI measures 2.79  cm. Pulmonic Valve: The pulmonic valve was normal in structure. Pulmonic valve regurgitation is not visualized. No evidence of pulmonic stenosis. Aorta: The aortic root is normal in size and structure. Venous: The inferior vena cava is normal in size with greater than 50% respiratory variability, suggesting right atrial pressure of 3 mmHg. IAS/Shunts: No atrial level shunt detected by color flow Doppler. Additional Comments: Chordal SAM noted with mildly elevated LVOT gradient (post PVC mean 15 mmHg).  LEFT VENTRICLE PLAX 2D LVIDd:         4.00 cm   Diastology LVIDs:         2.70 cm   LV e' medial:   7.62 cm/s LV PW:         1.10 cm   LV E/e' medial: 11.0 LV IVS:        1.30 cm LVOT diam:     2.00 cm LV SV:         91 LV SV Index:   47 LVOT Area:     3.14 cm  RIGHT VENTRICLE             IVC RV Basal diam:  3.00 cm     IVC diam: 1.70 cm RV S prime:     12.80 cm/s TAPSE (M-mode): 1.9 cm LEFT ATRIUM           Index        RIGHT ATRIUM           Index LA diam:      3.60 cm 1.87 cm/m   RA Area:     14.20 cm LA Vol (A4C): 48.4 ml 25.13 ml/m  RA Volume:   34.20 ml  17.76 ml/m  AORTIC VALVE AV Area (Vmax):    2.87 cm AV Area (Vmean):   3.05 cm AV Area (VTI):     2.79 cm AV Vmax:           164.00 cm/s AV Vmean:          107.000 cm/s AV VTI:            0.325 m AV Peak Grad:      10.8 mmHg AV Mean Grad:      6.0 mmHg LVOT Vmax:         150.00 cm/s LVOT Vmean:        104.000 cm/s LVOT VTI:          0.289 m LVOT/AV VTI ratio: 0.89  AORTA Ao Root diam: 3.40 cm Ao Asc diam:  3.30 cm MITRAL VALVE MV Area (PHT): 3.20 cm    SHUNTS MV Decel Time: 237 msec    Systemic VTI:  0.29 m MV E velocity: 83.90 cm/s  Systemic Diam: 2.00 cm MV A velocity: 85.20 cm/s MV E/A ratio:  0.98  Redell Shallow MD Electronically signed by Redell Shallow MD Signature Date/Time: 02/02/2024/10:26:21 AM    Final    DG Chest Port 1 View Result Date: 02/01/2024 CLINICAL DATA:  Shortness of breath, dizziness and lightheadedness. EXAM: PORTABLE CHEST 1 VIEW COMPARISON:  Chest CT dated 12/14/2021. FINDINGS: Mildly enlarged cardiac silhouette. Mildly prominent pulmonary vasculature. Mild chronic interstitial prominence. No airspace consolidation or pleural fluid. Unremarkable bones. IMPRESSION: 1. Mild cardiomegaly and mild pulmonary vascular congestion. 2. Mild chronic interstitial lung disease. Electronically Signed   By: Elspeth Bathe M.D.   On: 02/01/2024 09:43       Discharge Exam: Vitals:   02/02/24 0748 02/02/24 1223  BP: 126/87 138/86  Pulse: (!) 48 (!) 120  Resp: 16 14  Temp: 98.1 F (36.7 C) 98.5 F (36.9 C)  SpO2: 97% 94%    General: Pt is alert, awake, not in acute distress Cardiovascular: Irregular rhythm, PVCs on telemetry, S1/S2 +, no edema Respiratory: CTA bilaterally, no wheezing, no rhonchi, no respiratory distress, no conversational dyspnea  Abdominal: Soft, NT, ND, bowel sounds + Extremities: no edema, no cyanosis Psych: Normal mood and affect, stable judgement and insight     The results of significant diagnostics from this hospitalization (including imaging, microbiology, ancillary and laboratory) are listed below for reference.     Microbiology: No results found for this or any previous visit (from the past 240 hours).   Labs: BNP (last 3 results) No results for input(s): BNP in the last 8760 hours. Basic Metabolic Panel: Recent Labs  Lab 02/01/24 0923 02/01/24 2225 02/02/24 0520  NA 135  --  137  K 4.4  --  4.9  CL 100  --  102  CO2 23  --  27  GLUCOSE 103*  --  110*  BUN 17  --  14  CREATININE 1.24  --  1.29*  CALCIUM  9.6  --  9.1  MG  --  2.1  --    Liver Function Tests: No results for input(s): AST, ALT, ALKPHOS, BILITOT, PROT,  ALBUMIN in the last 168 hours. No results for input(s): LIPASE, AMYLASE in the last 168 hours. No results for input(s): AMMONIA in the last 168 hours. CBC: Recent Labs  Lab 02/01/24 0923 02/02/24 0520  WBC 4.7 4.9  HGB 16.0 16.6  HCT 46.1 47.9  MCV 85.1 86.6  PLT  186 193   Cardiac Enzymes: No results for input(s): CKTOTAL, CKMB, CKMBINDEX, TROPONINI in the last 168 hours. BNP: Invalid input(s): POCBNP CBG: No results for input(s): GLUCAP in the last 168 hours. D-Dimer No results for input(s): DDIMER in the last 72 hours. Hgb A1c No results for input(s): HGBA1C in the last 72 hours. Lipid Profile No results for input(s): CHOL, HDL, LDLCALC, TRIG, CHOLHDL, LDLDIRECT in the last 72 hours. Thyroid  function studies Recent Labs    02/01/24 0930  TSH 3.690   Anemia work up No results for input(s): VITAMINB12, FOLATE, FERRITIN, TIBC, IRON, RETICCTPCT in the last 72 hours. Urinalysis No results found for: COLORURINE, APPEARANCEUR, LABSPEC, PHURINE, GLUCOSEU, HGBUR, BILIRUBINUR, KETONESUR, PROTEINUR, UROBILINOGEN, NITRITE, LEUKOCYTESUR Sepsis Labs Recent Labs  Lab 02/01/24 0923 02/02/24 0520  WBC 4.7 4.9   Microbiology No results found for this or any previous visit (from the past 240 hours).   Patient was seen and examined on the day of discharge and was found to be in stable condition. Time coordinating discharge: 40 minutes including assessment and coordination of care, as well as examination of the patient.   SIGNED:  Delon Hoe, DO [facility] 02/02/2024, 1:10 PM

## 2024-02-02 NOTE — Care Management CC44 (Signed)
 Condition Code 44 Documentation Completed  Patient Details  Name: Kevin Vega MRN: [id number] Date of Birth: Sep 26, 1950   Condition Code 44 given:  Yes Patient signature on Condition Code 44 notice:  Yes Documentation of 2 MD's agreement:  Yes Code 44 added to claim:  Yes    Carletha Spruce, RN 02/02/2024, 11:08 AM

## 2024-02-02 NOTE — Progress Notes (Signed)
 Progress Note  Patient Name: Kevin Vega Date of Encounter: 02/02/2024  Primary Cardiologist: Dorn Lesches, MD  Interval Summary  Chart reviewed including consultation by cardiology fellow.  Spoke with the patient, his wife and daughter in the room today.  He reports a longstanding history of recurrent lightheadedness over the last few years at least.  Worse recently, he tells me that he was carrying and then sustaining a butcher block outdoors, with the fumes began to feel very lightheaded.  The symptoms lasted longer than he expected, he checked his blood pressure with cuff at home which reported bradycardia, heart rate in the 30s to 40s and was encouraged by his PCP to go to the ER.  It looks like this is actually pseudo-bradycardia in the setting of frequent PVCs, he has had no frank bradycardia or pauses by telemetry.  Does not report any chest pain, no sudden onset syncope.  Vital Signs  Vitals:   02/01/24 1929 02/02/24 0115 02/02/24 0448 02/02/24 0748  BP: (!) 146/73 122/78 134/84 126/87  Pulse: 67 (!) 56 60 (!) 48  Resp: 18 20 15 16   Temp: 98.2 F (36.8 C) 98.2 F (36.8 C) 97.6 F (36.4 C) 98.1 F (36.7 C)  TempSrc: Oral Oral Oral Oral  SpO2: 98% 98% 98% 97%  Weight:      Height:       No intake or output data in the 24 hours ending 02/02/24 1213 Filed Weights   02/01/24 1817  Weight: 76.9 kg    Physical Exam  GEN: No acute distress.   Neck: No JVD. Cardiac: RRR, very soft systolic murmur, no gallop.  Respiratory: Nonlabored. Clear to auscultation bilaterally. GI: Soft, nontender, bowel sounds present. MS: No edema.  ECG/Telemetry  Telemetry reviewed showing sinus rhythm with frequent PVCs, some ventricular couplets and ventricular bigeminy.  No VT.  Labs  Chemistry Recent Labs  Lab 02/01/24 0923 02/02/24 0520  NA 135 137  K 4.4 4.9  CL 100 102  CO2 23 27  GLUCOSE 103* 110*  BUN 17 14  CREATININE 1.24 1.29*  CALCIUM  9.6 9.1  GFRNONAA  >60 59*  ANIONGAP 12 8    Hematology Recent Labs  Lab 02/01/24 0923 02/02/24 0520  WBC 4.7 4.9  RBC 5.42 5.53  HGB 16.0 16.6  HCT 46.1 47.9  MCV 85.1 86.6  MCH 29.5 30.0  MCHC 34.7 34.7  RDW 12.9 12.7  PLT 186 193    Cardiac Studies  Echocardiogram 02/02/2024:  1. Chordal SAM noted with mildly elevated LVOT gradient (post PVC mean 15  mmHg).   2. Left ventricular ejection fraction, by estimation, is 60 to 65%. The  left ventricle has normal function. The left ventricle has no regional  wall motion abnormalities. There is mild left ventricular hypertrophy of  the basal segment. Left ventricular  diastolic parameters are consistent with Grade I diastolic dysfunction  (impaired relaxation).   3. Right ventricular systolic function is normal. The right ventricular  size is normal.   4. The mitral valve is normal in structure. No evidence of mitral valve  regurgitation. No evidence of mitral stenosis.   5. The aortic valve is tricuspid. Aortic valve regurgitation is not  visualized. No aortic stenosis is present.   6. The inferior vena cava is normal in size with greater than 50%  respiratory variability, suggesting right atrial pressure of 3 mmHg.   Assessment & Plan  1.  Longstanding history of recurring lightheadedness.  Seems to be most  often with heavy lifting or if working in the heat and sweating.  He has primary hypertension with normal LVEF at 60 to 65%, mild basal septal LV hypertrophy with mildly elevated LVOT gradient, so may be somewhat prone to relative volume contraction/dehydration symptoms.  2.  Frequent PVCs, newly documented.  He does not report any specific sense of palpitations and has had no chest pain or frank syncope.  This could also be contributing to a relative sense of lightheadedness.  Electrolytes are normal as is TSH.  PVCs look to be unifocal and upright in the inferior leads, could be LV outflow tract in origin suggesting more benign etiology.  LV  and RV function normal by echocardiogram.  3.  Primary hypertension, currently on Avapro  300 mg daily and HCTZ 25 mg daily.  4.  Mixed hyperlipidemia, on Crestor  20 mg daily.  Discussed situation with the patient and his family.  Anticipate discharge home today.  We will plan on scheduling a 7-day ZIO AT monitor and then follow-up in the office.  Need to better quantify PVC burden to help determine what additional studies may be necessary for workup.  We will arrange follow-up with Dr. Court as before.  For questions or updates, please contact [facility] Please consult [email or web address] for contact info under   Signed, Jayson Sierras, MD  02/02/2024, 12:13 PM

## 2024-02-04 ENCOUNTER — Ambulatory Visit

## 2024-02-04 ENCOUNTER — Ambulatory Visit: Attending: Physician Assistant | Admitting: Cardiology

## 2024-02-04 ENCOUNTER — Encounter: Payer: Self-pay | Admitting: Physician Assistant

## 2024-02-04 VITALS — BP 116/62 | HR 41 | Resp 16 | Ht 69.0 in | Wt 172.0 lb

## 2024-02-04 DIAGNOSIS — I493 Ventricular premature depolarization: Secondary | ICD-10-CM

## 2024-02-04 DIAGNOSIS — E782 Mixed hyperlipidemia: Secondary | ICD-10-CM | POA: Diagnosis not present

## 2024-02-04 DIAGNOSIS — I10 Essential (primary) hypertension: Secondary | ICD-10-CM | POA: Diagnosis not present

## 2024-02-04 DIAGNOSIS — R42 Dizziness and giddiness: Secondary | ICD-10-CM

## 2024-02-04 NOTE — Patient Instructions (Signed)
 Medication Instructions:  No medication changes were made during today's visit.  *If you need a refill on your cardiac medications before your next appointment, please call your pharmacy*   Lab Work: No labs were ordered during today's visit.  If you have labs (blood work) drawn today and your tests are completely normal, you will receive your results only by: MyChart Message (if you have MyChart) OR A paper copy in the mail If you have any lab test that is abnormal or we need to change your treatment, we will call you to review the results.   Testing/Procedures:  ZIO XT- Long Term Monitor Instructions   Your physician has requested you wear your ZIO patch monitor.  This is a single patch monitor.  [employer] supplies one patch monitor per enrollment.  Additional stickers are not available.   Please do not apply patch if you will be having a Nuclear Stress Test, Echocardiogram, Cardiac CT, MRI, or Chest Xray during the time frame you would be wearing the monitor. The patch cannot be worn during these tests.  You cannot remove and re-apply the ZIO XT patch monitor.   Your ZIO patch monitor will be sent USPS Priority mail from [facility] directly to your home address. The monitor may also be mailed to a PO BOX if home delivery is not available.   It may take 3-5 days to receive your monitor after you have been enrolled.   Once you have received you monitor, please review enclosed instructions.  Your monitor has already been registered assigning a specific monitor serial # to you.   Applying the monitor   Shave hair from upper left chest.   Hold abrader disc by orange tab.  Rub abrader in 40 strokes over left upper chest as indicated in your monitor instructions.   Clean area with 4 enclosed alcohol pads .  Use all pads to assure are is cleaned thoroughly.  Let dry.   Apply patch as indicated in monitor instructions.  Patch will be place under collarbone on left side of chest  with arrow pointing upward.   Rub patch adhesive wings for 2 minutes.Remove white label marked 1.  Remove white label marked 2.  Rub patch adhesive wings for 2 additional minutes.   While looking in a mirror, press and release button in center of patch.  A small green light will flash 3-4 times .  This will be your only indicator the monitor has been turned on.     Do not shower for the first 24 hours.  You may shower after the first 24 hours.   Press button if you feel a symptom. You will hear a small click.  Record Date, Time and Symptom in the Patient Log Book.   When you are ready to remove patch, follow instructions on last 2 pages of Patient Log Book.  Stick patch monitor onto last page of Patient Log Book.   Place Patient Log Book in [place] box.  Use locking tab on box and tape box closed securely.  The [employer] has [employer] on it.  Please place in mailbox as soon as possible.  Your physician should have your test results approximately 7 days after the monitor has been mailed back to [facility].   Call [facility] Customer Care at [phone] if you have questions regarding your ZIO XT patch monitor.  Call them immediately if you see an orange light blinking on your monitor.   If your monitor  falls off in less than 4 days contact our Monitor department at [phone].  If your monitor becomes loose or falls off after 4 days call [employer] at [phone] for suggestions on securing your monitor.    Your next appointment:   4 month(s)   Provider:   Dorn Lesches, MD     Follow-Up: At [facility], you and your health needs are our priority.  As part of our continuing mission to provide you with exceptional heart care, we have created designated Provider Care Teams.  These Care Teams include your primary Cardiologist (physician) and Advanced Practice Providers (APPs -  Physician Assistants and Nurse Practitioners) who all work together to  provide you with the care you need, when you need it. We recommend signing up for the patient portal called MyChart.  Sign up information is provided on this After Visit Summary.  MyChart is used to connect with patients for Virtual Visits (Telemedicine).  Patients are able to view lab/test results, encounter notes, upcoming appointments, etc.  Non-urgent messages can be sent to your provider as well.   To learn more about what you can do with MyChart, go to [email or web address].

## 2024-02-04 NOTE — Progress Notes (Unsigned)
Enrolled for [employer] to mail a ZIO AT Live Telemetry monitor to patients address on file.   Dr. Berry to read. 

## 2024-02-04 NOTE — Progress Notes (Signed)
 Cardiology Office Note:  .   Date:  02/04/2024  ID:  Kevin Vega, DOB July 10, 1950, MRN [id number] PCP: Kevin Nottingham, MD  [facility] Providers Cardiologist:  Dorn Lesches, MD {  History of Present Illness: .   Kevin Vega is a 73 y.o. male with history of PVCs, chronic hepatitis C, hypertension, hyperlipidemia, chronic alcohol abuse.     Pseudo bradycardia Admission on 08/2023 with longstanding history of recurrent lightheadedness.  Had persistent episode.  Reportedly bradycardic 30s to 40s but inaccurately low due to frequent PVCs.  Telemetry benign.  Live ZIO monitor pending   Other Normal Lexiscan  12/2017 CAC scoring 41.9 01/2024 Echocardiogram 01/2024 preserved EF.  Mildly elevated LVOT  Social history  Retired Music therapist. Drinks 3 beers daily     Patient with no prior significant cardiac history with reassuring Lexiscan  and CAC scoring.  Recently admitted 01/2024 for complaints of lightheadedness but does have chronic history of this.  Reportedly bradycardic but falsely inaccurate due to frequent PVCs.  They were discharged on live ZIO monitor but is still pending and has not been mailed yet.   Today patient presents for follow-up.  He reports that he has had chronic issues of lightheadedness.  The episode that brought him to the hospital he was outside all day long working on a kitchen countertop with a Midwife block.  He was sweating excessively all day long and was not drinking water.  Also working with stain and inhaling fumes without any protective facial covering.  Labs suggested mild AKI as well.  However he has not had any episodes of passing out, presyncope, falls.  Otherwise doing well and without any other complaints.   ROS: Denies: Chest pain, shortness of breath, orthopnea, peripheral edema, palpitations, decreased exercise intolerance, fatigue, lightheadedness.   Studies Reviewed: .         Risk Assessment/Calculations:             Physical  Exam:   VS:  BP 116/62 (BP Location: Left Arm, Patient Position: Sitting, Cuff Size: Normal)   Pulse (!) 41   Resp 16   Ht 5' 9 (1.753 m)   Wt 172 lb (78 kg)   SpO2 98%   BMI 25.40 kg/m    Wt Readings from Last 3 Encounters:  02/04/24 172 lb (78 kg)  02/01/24 169 lb 9.6 oz (76.9 kg)  05/31/23 179 lb 4 oz (81.3 kg)    GEN: Well nourished, well developed in no acute distress NECK: No JVD; No carotid bruits CARDIAC: RRR, no murmurs, rubs, gallops. 3/6 murmur at the apex.  PVCs RESPIRATORY:  Clear to auscultation without rales, wheezing or rhonchi  ABDOMEN: Soft, non-tender, non-distended EXTREMITIES:  No edema; No deformity   ASSESSMENT AND PLAN: .    Dizziness Seems to have very clear culprits for this and likely more related to dehydration than anything else.  Reports episode when he is severely dehydrated, out in the heat and working on projects that includes paint stain.  Echocardiogram is overall benign, there is some chordal SAM with mildly elevated LVOT but not likely clinically relevant.  PVCs Essentially asymptomatic.  Does not have true bradycardia, likely inaccurate measurement given frequent PVCs.  Heart monitor pending, this will be mailed to him today.  Will define PVC burden and adjust therapy from there. 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  Asymptomatic. Continue aspirin, rosuvastatin  20 mg daily  HTN Blood pressure well-controlled, 116/62. Continue  with olmesartan 40 mg, amlodipine 2.5 mg  Hepatitis C Alcohol abuse Drinks 2 beers daily.  Encouraged cessation  Dispo: 48-month follow-up Dr. Court  Signed, Thom LITTIE Sluder, PA-C

## 2024-02-09 ENCOUNTER — Encounter: Payer: Self-pay | Admitting: Cardiovascular Disease

## 2024-02-11 ENCOUNTER — Telehealth: Payer: Self-pay | Admitting: Cardiovascular Disease

## 2024-02-11 NOTE — Telephone Encounter (Signed)
 Pt c/o medication issue:  1. Name of Medication: Triamcinolone (cream)  2. How are you currently taking this medication (dosage and times per day)?   Not using  3. Are you having a reaction (difficulty breathing--STAT)?   4. What is your medication issue?    Patient he forgot to add that he used this medication in early July and 5 days in the middle of August and rubbed heavily on neck, head and shoulders.  Patient stated this medication caused him to have a rash and is concerned this may be causing side effect.  Patient also noted he is wearing a heart monitor and wants to know how long he needs to wear it.  Patient stated can also send him a message on MyChart.

## 2024-02-11 NOTE — Telephone Encounter (Signed)
 Spoke with pt regarding a cream he was using throughout July and Aug. Pt stated his dermatologist prescribed a steroid cream called Trimcinolone for a rash he had on his neck, chest and shoulders. Pt stated he was using the cream heavily in July. Pt stated he googled the side effects and found that they were similar to the symptoms he has been experiencing, including dizziness and rapid heart rate. Pt wanted Kevin Sluder, PA-C to know. Pt also was unsure of how long he should be wearing the heart monitor. The order was for 7 days. Pt notified. Pt also would like his results sent to his PCP. Pt was told that when he receives the results they can be sent to his PCP. Pt verbalized understanding. All questions if any were answered.

## 2024-02-11 NOTE — Telephone Encounter (Signed)
 See phone note from 9/15

## 2024-02-21 ENCOUNTER — Telehealth: Payer: Self-pay | Admitting: Cardiovascular Disease

## 2024-02-21 NOTE — Telephone Encounter (Signed)
 STAT if patient feels like he/she is going to faint   1. Are you feeling dizzy, lightheaded, or faint right now?   Wife stated patient has been feeling dizzy/lightheaded for most of the day which lessens at night  2. Have you passed out?  No   (If yes move to .SYNCOPECHMG)  3. Do you have any other symptoms?   No  4. Have you checked your HR and BP (record if available)?   Unknown  Wife (Angie) is concerned patient has been feeling dizzy/lightheaded for most of the day and wants advice on next steps.

## 2024-02-21 NOTE — Telephone Encounter (Signed)
 Spoke with Angie, patient's wife. Wife is worried, patient is not aware wife is calling. He is dizzy and lightheaded. This has been going on for several weeks. Echo was done in hospital. Monitor is pending - was mailed back 1 week ago. Was evaluated by Thom PA on 9/8. Patient checks BP and HR at home but wife does not have access to these right now. Advised she can call back with this or send via MyChart. Message to MD/PA and monitor tech team. Will need monitor results before advising on plan most likely.

## 2024-02-23 DIAGNOSIS — R42 Dizziness and giddiness: Secondary | ICD-10-CM | POA: Diagnosis not present

## 2024-02-23 DIAGNOSIS — I493 Ventricular premature depolarization: Secondary | ICD-10-CM

## 2024-02-25 ENCOUNTER — Ambulatory Visit: Payer: Self-pay | Admitting: Physician Assistant

## 2024-02-26 NOTE — Progress Notes (Signed)
 Mr. Railey is scheduled to see you on 10/8

## 2024-03-05 ENCOUNTER — Ambulatory Visit: Attending: Cardiology | Admitting: Cardiovascular Disease

## 2024-03-05 ENCOUNTER — Encounter: Payer: Self-pay | Admitting: Cardiovascular Disease

## 2024-03-05 VITALS — BP 169/72 | HR 69 | Resp 16 | Ht 69.0 in | Wt 172.0 lb

## 2024-03-05 DIAGNOSIS — R931 Abnormal findings on diagnostic imaging of heart and coronary circulation: Secondary | ICD-10-CM | POA: Diagnosis not present

## 2024-03-05 DIAGNOSIS — I493 Ventricular premature depolarization: Secondary | ICD-10-CM | POA: Diagnosis not present

## 2024-03-05 DIAGNOSIS — I10 Essential (primary) hypertension: Secondary | ICD-10-CM | POA: Diagnosis not present

## 2024-03-05 DIAGNOSIS — E782 Mixed hyperlipidemia: Secondary | ICD-10-CM | POA: Diagnosis not present

## 2024-03-05 NOTE — Assessment & Plan Note (Signed)
 Mildly elevated coronary calcium  score of 42 distributed in all 3 coronary arteries performed 08/09/2020.  He is at goal for secondary prevention and denies chest pain.

## 2024-03-05 NOTE — Patient Instructions (Signed)

## 2024-03-05 NOTE — Assessment & Plan Note (Signed)
 Recent monitor showed PVCs with a 16% burden, short runs of SVT and NSVT.  He was admitted to the hospital with dizziness last month.  He saw Dr. Clarice , his PCP, who began him on low-dose metoprolol which was resulted in marked improvement in his symptoms.

## 2024-03-05 NOTE — Assessment & Plan Note (Signed)
 History of hyperlipidemia on rosuvastatin  20 mg a day with lipid profile performed 02/18/2024 revealing total cholesterol 106, LDL 53 and HDL of 34.

## 2024-03-05 NOTE — Assessment & Plan Note (Signed)
 History of essential hypertension with blood pressure measured today at 169/72.  He is on amlodipine, olmesartan and metoprolol.  He says he checks his blood pressure at home and it usually in the 130/60 range.

## 2024-03-05 NOTE — Progress Notes (Signed)
 03/05/2024 Kevin Vega   1951-04-06  [id number]  Primary Physician Kevin Nottingham, MD Primary Cardiologist: Kevin JINNY Lesches MD GENI Kevin Vega  HPI:  Kevin Vega is a 73 y.o.  only overweight married Caucasian male father of 2, grandfather 3 grandchildren referred by Dr. Clarice for evaluation of chest pain or shortness of breath.  I last saw him in the office 03/23/2021.  He is accompanied by his wife Kevin Vega today.  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  Risk factors are notable for treated hypertension untreated mild hyperlipidemia.  He is never had a heart attack or stroke.  There is no family history.  Is noticed increasing dyspnea on exertion and occasional chest pain on exertion over the last several months.   He had a Myoview  stress test on him 01/09/2018 which was nonischemic and low risk that 2D echo performed 01/03/2018 which was normal as well.     He had a coronary calcium  score performed 08/09/2020 which was 42 with calcium  in all 3 coronary arteries primarily circumflex and RCA.  Since I saw him 3 years ago he was admitted to the hospital last month with dizziness.  A subsequent event monitor showed frequent PVCs with a 16% burden, short runs of SVT and NSVT.  2D echo was essentially normal with grade 1 diastolic dysfunction.  He is saw his PCP who began him on low-dose metoprolol which resulted in marked improvement in his symptoms.   Current Meds  Medication Sig   amLODipine (NORVASC) 2.5 MG tablet Take 2.5 mg by mouth daily.   aspirin EC 81 MG tablet Take 81 mg by mouth daily.   Cetirizine HCl 10 MG CAPS Take by mouth daily in the afternoon.   Cholecalciferol (D3) 50 MCG (2000 UT) TABS Take 1 tablet by mouth daily in the afternoon.   Cyanocobalamin (VITAMIN B-12) 2500 MCG SUBL Place 1,000 mcg under the tongue daily.   metoprolol succinate (TOPROL-XL) 25 MG 24 hr tablet Take 25 mg by mouth daily.    olmesartan (BENICAR) 40 MG tablet Take 40 mg by mouth daily.   omeprazole  (PRILOSEC) 20 MG capsule Take 1 capsule (20 mg total) by mouth daily before breakfast.   rosuvastatin  (CRESTOR ) 20 MG tablet Take 20 mg by mouth daily.   tadalafil (CIALIS) 5 MG tablet Take 5 mg by mouth daily.     Allergies  Allergen Reactions   Chlorthalidone     Other reaction(s): hyponatremia   Codeine     Causes anxiety   Penicillins     Per pt: as child; unknown reaction    Social History   Socioeconomic History   Marital status: Married    Spouse name: Not on file   Number of children: 2   Years of education: Not on file   Highest education level: Not on file  Occupational History   Occupation: Music therapist  Tobacco Use   Smoking status: Never   Smokeless tobacco: Never  Vaping Use   Vaping status: Never Used  Substance and Sexual Activity   Alcohol use: Yes    Comment: 2 beers daily   Drug use: No   Sexual activity: Not on file  Other Topics Concern   Not on file  Social History Narrative   Not on file   Social Drivers of Health   Financial Resource Strain: Not on file  Food Insecurity: Not on file  Transportation Needs: Not on file  Physical Activity: Not on file  Stress: Not on file  Social Connections: Not on file  Intimate Partner Violence: Not on file     Review of Systems: General: negative for chills, fever, night sweats or weight changes.  Cardiovascular: negative for chest pain, dyspnea on exertion, edema, orthopnea, palpitations, paroxysmal nocturnal dyspnea or shortness of breath Dermatological: negative for rash Respiratory: negative for cough or wheezing Urologic: negative for hematuria Abdominal: negative for nausea, vomiting, diarrhea, bright red blood per rectum, melena, or hematemesis Neurologic: negative for visual changes, syncope, or dizziness All other systems reviewed and are otherwise negative except as noted above.    Blood pressure (!) 169/72,  pulse 69, resp. rate 16, height 5' 9 (1.753 m), weight 172 lb (78 kg), SpO2 98%.  General appearance: alert and no distress Neck: no adenopathy, no carotid bruit, no JVD, supple, symmetrical, trachea midline, and thyroid  not enlarged, symmetric, no tenderness/mass/nodules Lungs: clear to auscultation bilaterally Heart: regular rate and rhythm, S1, S2 normal, no murmur, click, rub or gallop Extremities: extremities normal, atraumatic, no cyanosis or edema Pulses: 2+ and symmetric Skin: Skin color, texture, turgor normal. No rashes or lesions Neurologic: Grossly normal  EKG not performed today      ASSESSMENT AND PLAN:   Essential hypertension History of essential hypertension with blood pressure measured today at 169/72.  He is on amlodipine, olmesartan and metoprolol.  He says he checks his blood pressure at home and it usually in the 130/60 range.  Hyperlipidemia History of hyperlipidemia on rosuvastatin  20 mg a day with lipid profile performed 02/18/2024 revealing total cholesterol 106, LDL 53 and HDL of 34.  Elevated coronary artery calcium  score Mildly elevated coronary calcium  score of 42 distributed in all 3 coronary arteries performed 08/09/2020.  He is at goal for secondary prevention and denies chest pain.  Frequent PVCs Recent monitor showed PVCs with a 16% burden, short runs of SVT and NSVT.  He was admitted to the hospital with dizziness last month.  He saw Dr. Clarice , his PCP, who began him on low-dose metoprolol which was resulted in marked improvement in his symptoms.     Kevin DOROTHA Lesches MD FACP,FACC,FAHA, [facility] 03/05/2024 10:18 AM

## 2024-04-18 NOTE — Progress Notes (Unsigned)
 Kevin Vega D.[facility] [address] [place] [address] Phone: [phone]   Assessment and Plan:     1. Chronic pain of left knee (Primary) -Chronic with exacerbation, initial sports medicine visit - Acute exacerbation of chronic medial left knee pain most consistent with mild meniscal pathology versus mild medial compartment osteoarthritis - Patient brought in x-ray disc.  Reviewed x-ray with patient in clinic.  My interpretation: No acute fracture or dislocation.  Mildly decreased medial joint space.  Small suprapatellar spur - Start meloxicam  15 mg daily x2 weeks.  If still having pain after 2 weeks, complete 3rd-week of NSAID. May use remaining NSAID as needed once daily for pain control.  Do not to use additional over-the-counter NSAIDs (ibuprofen, naproxen, Advil, Aleve, etc.) while taking prescription NSAIDs.  May use Tylenol [phone] mg 2 to 3 times a day for breakthrough pain. - Start HEP for knee  15 additional minutes spent for educating Therapeutic Home Exercise Program.  This included exercises focusing on stretching, strengthening, with focus on eccentric aspects.   Long term goals include an improvement in range of motion, strength, endurance as well as avoiding reinjury. Patient's frequency would include in 1-2 times a day, 3-5 times a week for a duration of 6-12 weeks. Proper technique shown and discussed handout in great detail with ATC.  All questions were discussed and answered.      Pertinent previous records reviewed include none   Follow Up: 4 weeks for reevaluation.  If no improvement or worsening of symptoms.  Could consider CSI versus physical therapy   Subjective:   I, Sherlon Nied, am serving as a neurosurgeon for Doctor Morene Mace  Chief Complaint: left knee pain   HPI:   04/23/2024 Patient is a 73 year old male with left knee pain. Patient states pain started years ago. Pain has gotten worse over the past  couple of weeks. Decreased ROM. Pain when sitting to getting up. Pain with going up and down steps. Knee feels weak. Feels like his leg might give out on him. No meds for the pain. Aleve PRN. Voltaren gel helped a little. Pain is intermittent.   Relevant Historical Information: Hypertension  Additional pertinent review of systems negative.   Current Outpatient Medications:    meloxicam  (MOBIC ) 15 MG tablet, Take 1 tablet daily for 2 weeks.  If still in pain after 2 weeks, take 1 tablet daily for an additional 1 week., Disp: 30 tablet, Rfl: 0   amLODipine (NORVASC) 2.5 MG tablet, Take 2.5 mg by mouth daily., Disp: , Rfl:    aspirin EC 81 MG tablet, Take 81 mg by mouth daily., Disp: , Rfl:    Cetirizine HCl 10 MG CAPS, Take by mouth daily in the afternoon., Disp: , Rfl:    Cholecalciferol (D3) 50 MCG (2000 UT) TABS, Take 1 tablet by mouth daily in the afternoon., Disp: , Rfl:    Cyanocobalamin (VITAMIN B-12) 2500 MCG SUBL, Place 1,000 mcg under the tongue daily., Disp: , Rfl:    metoprolol succinate (TOPROL-XL) 25 MG 24 hr tablet, Take 25 mg by mouth daily., Disp: , Rfl:    olmesartan (BENICAR) 40 MG tablet, Take 40 mg by mouth daily., Disp: , Rfl:    omeprazole  (PRILOSEC) 20 MG capsule, Take 1 capsule (20 mg total) by mouth daily before breakfast., Disp: 90 capsule, Rfl: 3   rosuvastatin  (CRESTOR ) 20 MG tablet, Take 20 mg by mouth daily., Disp: , Rfl:  tadalafil (CIALIS) 5 MG tablet, Take 5 mg by mouth daily., Disp: , Rfl:    Objective:     Vitals:   04/23/24 0846  BP: 108/80  Pulse: 78  SpO2: 99%  Weight: 173 lb (78.5 kg)  Height: 5' 9 (1.753 m)      Body mass index is 25.55 kg/m.    Physical Exam:    General:  awake, alert oriented, no acute distress nontoxic Skin: no suspicious lesions or rashes Neuro:sensation intact and strength 5/5 with no deficits, no atrophy, normal muscle tone Psych: No signs of anxiety, depression or other mood disorder  Left knee: No  swelling No deformity Neg fluid wave, joint milking ROM Flex 110, Ext 0 TTP medial joint line NTTP over the quad tendon, medial fem condyle, lat fem condyle, patella, patella tendon, tibial tuberostiy, fibular head, posterior fossa, pes anserine bursa, gerdy's tubercle,   lateral jt line Neg anterior and posterior drawer Neg lachman Negative varus stress Negative valgus stress Negative McMurray Positive Thessaly for medial knee pain  Gait normal    Electronically signed by:  Odis Mace D.[facility] 9:10 AM 04/23/24

## 2024-04-23 ENCOUNTER — Ambulatory Visit: Admitting: Sports Medicine

## 2024-04-23 VITALS — BP 108/80 | HR 78 | Ht 69.0 in | Wt 173.0 lb

## 2024-04-23 DIAGNOSIS — G8929 Other chronic pain: Secondary | ICD-10-CM | POA: Diagnosis not present

## 2024-04-23 DIAGNOSIS — M25562 Pain in left knee: Secondary | ICD-10-CM

## 2024-04-23 MED ORDER — MELOXICAM 15 MG PO TABS: ORAL_TABLET | ORAL | 0 refills | Status: DC

## 2024-04-23 NOTE — Patient Instructions (Signed)
-   Start meloxicam 15 mg daily x2 weeks.  If still having pain after 2 weeks, complete 3rd-week of NSAID. May use remaining NSAID as needed once daily for pain control.  Do not to use additional over-the-counter NSAIDs (ibuprofen, naproxen, Advil, Aleve, etc.) while taking prescription NSAIDs.  May use Tylenol [phone] mg 2 to 3 times a day for breakthrough pain. Knee HEP  4 week follow up

## 2024-05-16 NOTE — Progress Notes (Unsigned)
 "               Odis Mace D[facility] [address] [place] [address] Phone: [phone]   Assessment and Plan:     1. Chronic pain of left knee (Primary) -Chronic with exacerbation, subsequent visit - Overall significant improvement in acute flare of chronic medial left knee pain consistent with resolving flare of mild medial compartment osteoarthritis - Use meloxicam  15 mg daily as needed for breakthrough pain.  Recommend limiting chronic NSAIDs to 1-2 doses per week to prevent long-term side effects. Use Tylenol 500 to 1000 mg tablets 2-3 times a day as needed for day-to-day pain relief.    - May use topical Voltaren gel over areas of pain - Continue HEP    Pertinent previous records reviewed include none   Follow Up: As needed.  Could consider repeat NSAID course versus CSI versus physical therapy   Subjective:   I, Nasir Bright, am serving as a neurosurgeon for Doctor Morene Mace   Chief Complaint: left knee pain    HPI:    04/23/2024 Patient is a 73 year old male with left knee pain. Patient states pain started years ago. Pain has gotten worse over the past couple of weeks. Decreased ROM. Pain when sitting to getting up. Pain with going up and down steps. Knee feels weak. Feels like his leg might give out on him. No meds for the pain. Aleve PRN. Voltaren gel helped a little. Pain is intermittent.   05/19/2024 Patient states knee is much better. Flares less present    Relevant Historical Information: Hypertension  Additional pertinent review of systems negative.  Current Medications[1]   Objective:     Vitals:   05/19/24 0932  Pulse: (!) 57  SpO2: 96%  Weight: 173 lb (78.5 kg)  Height: 5' 9 (1.753 m)      Body mass index is 25.55 kg/m.    Physical Exam:    General:  awake, alert oriented, no acute distress nontoxic Skin: no suspicious lesions or rashes Neuro:sensation intact and strength 5/5 with no deficits, no  atrophy, normal muscle tone Psych: No signs of anxiety, depression or other mood disorder  Left knee: No swelling No deformity Neg fluid wave, joint milking ROM Flex 110, Ext 0 NTTP over the quad tendon, medial fem condyle, lat fem condyle, patella, patella tendon, tibial tuberostiy, fibular head, posterior fossa, pes anserine bursa, gerdy's tubercle, medial jt line, lateral jt line Neg anterior and posterior drawer Neg lachman Negative varus stress Negative valgus stress Negative McMurray Negative Thessaly  Gait normal    Electronically signed by:  Odis Mace D[facility] 9:51 AM 05/19/2024     [1]  Current Outpatient Medications:    amLODipine (NORVASC) 2.5 MG tablet, Take 2.5 mg by mouth daily., Disp: , Rfl:    aspirin EC 81 MG tablet, Take 81 mg by mouth daily., Disp: , Rfl:    Cetirizine HCl 10 MG CAPS, Take by mouth daily in the afternoon., Disp: , Rfl:    Cholecalciferol (D3) 50 MCG (2000 UT) TABS, Take 1 tablet by mouth daily in the afternoon., Disp: , Rfl:    Cyanocobalamin (VITAMIN B-12) 2500 MCG SUBL, Place 1,000 mcg under the tongue daily., Disp: , Rfl:    meloxicam  (MOBIC ) 15 MG tablet, Take 1 tablet daily for 2 weeks.  If still in pain after 2 weeks, take 1 tablet daily for an additional 1 week., Disp: 30 tablet,  Rfl: 0   metoprolol succinate (TOPROL-XL) 25 MG 24 hr tablet, Take 25 mg by mouth daily., Disp: , Rfl:    olmesartan (BENICAR) 40 MG tablet, Take 40 mg by mouth daily., Disp: , Rfl:    omeprazole  (PRILOSEC) 20 MG capsule, Take 1 capsule (20 mg total) by mouth daily before breakfast., Disp: 90 capsule, Rfl: 3   rosuvastatin  (CRESTOR ) 20 MG tablet, Take 20 mg by mouth daily., Disp: , Rfl:    tadalafil (CIALIS) 5 MG tablet, Take 5 mg by mouth daily., Disp: , Rfl:   "

## 2024-05-19 ENCOUNTER — Ambulatory Visit: Admitting: Sports Medicine

## 2024-05-19 VITALS — HR 57 | Ht 69.0 in | Wt 173.0 lb

## 2024-05-19 DIAGNOSIS — G8929 Other chronic pain: Secondary | ICD-10-CM

## 2024-05-19 DIAGNOSIS — M25562 Pain in left knee: Secondary | ICD-10-CM

## 2024-05-19 NOTE — Patient Instructions (Signed)
-   Use meloxicam  15 mg daily as needed for breakthrough pain.  Recommend limiting chronic NSAIDs to 1-2 doses per week to prevent long-term side effects. Use Tylenol 500 to 1000 mg tablets 2-3 times a day as needed for day-to-day pain relief.    Continue HEP and Voltaren gel over areas of pain   As needed follow up

## 2024-06-03 ENCOUNTER — Ambulatory Visit: Payer: Self-pay | Admitting: Gastroenterology

## 2024-06-03 ENCOUNTER — Encounter: Payer: Self-pay | Admitting: Gastroenterology

## 2024-06-03 ENCOUNTER — Other Ambulatory Visit (INDEPENDENT_AMBULATORY_CARE_PROVIDER_SITE_OTHER)

## 2024-06-03 ENCOUNTER — Ambulatory Visit (INDEPENDENT_AMBULATORY_CARE_PROVIDER_SITE_OTHER): Admitting: Gastroenterology

## 2024-06-03 VITALS — BP 148/90 | HR 60 | Ht 69.0 in | Wt 179.0 lb

## 2024-06-03 DIAGNOSIS — Z800 Family history of malignant neoplasm of digestive organs: Secondary | ICD-10-CM

## 2024-06-03 DIAGNOSIS — K449 Diaphragmatic hernia without obstruction or gangrene: Secondary | ICD-10-CM | POA: Diagnosis not present

## 2024-06-03 DIAGNOSIS — Z860100 Personal history of colon polyps, unspecified: Secondary | ICD-10-CM | POA: Diagnosis not present

## 2024-06-03 DIAGNOSIS — K2100 Gastro-esophageal reflux disease with esophagitis, without bleeding: Secondary | ICD-10-CM

## 2024-06-03 LAB — B12 AND FOLATE PANEL
Folate: >23.7 ng/mL
Vitamin B-12: 833 pg/mL (ref 211–911)

## 2024-06-03 LAB — BASIC METABOLIC PANEL WITH GFR
BUN: 20 mg/dL (ref 6–23)
CO2: 28 meq/L (ref 19–32)
Calcium: 9.4 mg/dL (ref 8.4–10.5)
Chloride: 102 meq/L (ref 96–112)
Creatinine, Ser: 1.12 mg/dL (ref 0.40–1.50)
GFR: 65.16 mL/min
Glucose, Bld: 89 mg/dL (ref 70–99)
Potassium: 4.4 meq/L (ref 3.5–5.1)
Sodium: 136 meq/L (ref 135–145)

## 2024-06-03 LAB — VITAMIN D 25 HYDROXY (VIT D DEFICIENCY, FRACTURES): VITD: 49.53 ng/mL (ref 30.00–100.00)

## 2024-06-03 LAB — IBC + FERRITIN
Ferritin: 58.1 ng/mL (ref 22.0–322.0)
Iron: 129 ug/dL (ref 42–165)
Saturation Ratios: 34.4 % (ref 20.0–50.0)
TIBC: 375.2 ug/dL (ref 250.0–450.0)
Transferrin: 268 mg/dL (ref 212.0–360.0)

## 2024-06-03 NOTE — Progress Notes (Signed)
 "  Chief Complaint:    GERD  GI History: 74 year old male with a history of HTN, chronic hepatitis C status post Harvoni, liver hemangioma, hyperlipidemia, personal history of adenomatous polyps, father with history of colon cancer, follows in the GI clinic for the following:   1) History of colon polyps, Family history of colon cancer (father): - 11/2013 Colonoscopy Dr. Teressa: two subcentimeter adenomas removed, recall recommended in 5 years.  -04/26/2021 colonoscopy Dr. Teressa per adenomatous polyp history and family history of colon cancer showed 3 mm polyp distal rectum, diverticulosis.  Pathology showed tubular adenoma recall 7 years. (03/2028)   2) GERD complicated by peptic stricture, erosive esophagitis, and hiatal hernia.  Reflux generally controlled with Protonix  40 mg twice daily, but complicated by loose stools and increased gas.  Reduced to 40 mg daily and added Florastor in 2024.  Added Pepcid 40 mg every morning in 2024, but not as efficacious as PPI and no change with gas/loose stools -08/04/2022 EGD Dr. San for dysphagia and reflux esophageal stenosis dilated 20 mm, LA grade A reflux esophagitis (path: Reflux changes), normal third of esophagus and middle third of esophagus, 2 cm hiatal hernia, normal stomach normal duodenum.  Improvement in dysphagia with esophageal dilation.   3) History of hepatitis C: Chronic hepatitis C liver biopsy 2006 minimally active disease.   2016 Harvoni treatment at [facility] liver clinic complete cure.   06/2015 fibrosis score FO/F1.   4) Loose stools - 10/2022: Normal pancreatic fecal elastase and negative infectious panel  HPI:     Patient is a 74 y.o. male presenting to the Gastroenterology Clinic for routine follow-up.  Last seen by me on 05/31/2023.  Reflux well-controlled on Prilosec 20 mg every morning.   Reviewed most recent labs from 01/2024: - Normal CBC - BUN/creatinine 14/1.29, normal electrolytes  No new abdominal imaging for  review today.  He is otherwise without any active issues or concerns today.  Review of systems:     No chest pain, no SOB, no fevers, no urinary sx   Past Medical History:  Diagnosis Date   Alcohol abuse, unspecified    Allergy    Anxiety    Arthritis    Blood transfusion without reported diagnosis 1959   as child   Chest pain    Chronic hepatitis C without mention of hepatic coma    Hemangioma of intra-abdominal structures    Hepatitis C    Hyperlipidemia    Hypertension    Insomnia, unspecified    Personal history of colonic polyps     Patient's surgical history, family medical history, social history, medications and allergies were all reviewed in Epic    Current Outpatient Medications  Medication Sig Dispense Refill   amLODipine (NORVASC) 2.5 MG tablet Take 2.5 mg by mouth daily.     aspirin EC 81 MG tablet Take 81 mg by mouth daily.     Cetirizine HCl 10 MG CAPS Take by mouth daily in the afternoon.     Cholecalciferol (D3) 50 MCG (2000 UT) TABS Take 1 tablet by mouth daily in the afternoon.     Cyanocobalamin (VITAMIN B-12) 2500 MCG SUBL Place 1,000 mcg under the tongue daily.     metoprolol succinate (TOPROL-XL) 25 MG 24 hr tablet Take 25 mg by mouth daily.     olmesartan (BENICAR) 40 MG tablet Take 40 mg by mouth daily.     omeprazole  (PRILOSEC) 20 MG capsule Take 1 capsule (20 mg total) by mouth daily before  breakfast. 90 capsule 3   rosuvastatin  (CRESTOR ) 20 MG tablet Take 20 mg by mouth daily.     tadalafil (CIALIS) 5 MG tablet Take 5 mg by mouth daily.     No current facility-administered medications for this visit.    Physical Exam:     Ht 5' 9 (1.753 m)   Wt 179 lb (81.2 kg)   BMI 26.43 kg/m   GENERAL:  Pleasant male in NAD PSYCH: : Cooperative, normal affect Musculoskeletal:  Normal muscle tone, normal strength NEURO: Alert and oriented x 3, no focal neurologic deficits   IMPRESSION and PLAN:    1) GERD with erosive esophagitis 2) Hiatal  hernia Reflux well-controlled on current therapy. - Resume Prilosec 20 mg every morning - Continue antireflux lifestyle/dietary modifications - Discussed routine annual labs given chronic PPI therapy.  Ordered BMP, B12, folate, vitamin D , iron panel for routine yearly assessment - Dysphagia resolved with prior EGD with dilation and improvement in reflux management   3) Family history of colon cancer 4) Personal history of colon polyps - Repeat colonoscopy in 03/2028 for ongoing surveillance     RTC in 1 year or sooner as needed          Sandor LULLA Flatter ,DO, FACG 06/03/2024, 8:34 AM  "

## 2024-06-03 NOTE — Patient Instructions (Addendum)
 _______________________________________________________  If your blood pressure at your visit was 140/90 or greater, please contact your primary care physician to follow up on this.  _______________________________________________________  If you are age 74 or older, your body mass index should be between 23-30. Your Body mass index is 26.43 kg/m. If this is out of the aforementioned range listed, please consider follow up with your Primary Care Provider.  If you are age 45 or younger, your body mass index should be between 19-25. Your Body mass index is 26.43 kg/m. If this is out of the aformentioned range listed, please consider follow up with your Primary Care Provider.   ________________________________________________________  The [facility] providers would like to encourage you to use MYCHART to communicate with providers for non-urgent requests or questions.  Due to long hold times on the telephone, sending your provider a message by [facility] may be a faster and more efficient way to get a response.  Please allow 48 business hours for a response.  Please remember that this is for non-urgent requests.  _______________________________________________________  [facility] is using a team-based approach to care.  Your team is made up of your doctor and two to three APPS. Our APPS (Nurse Practitioners and Physician Assistants) work with your physician to ensure care continuity for you. They are fully qualified to address your health concerns and develop a treatment plan. They communicate directly with your gastroenterologist to care for you. Seeing the Advanced Practice Practitioners on your physician's team can help you by facilitating care more promptly, often allowing for earlier appointments, access to diagnostic testing, procedures, and other specialty referrals.   Your provider has requested that you go to the basement level for lab work before leaving today. Press B on the  elevator. The lab is located at the first door on the left as you exit the elevator.  Due to recent changes in healthcare laws, you may see the results of your imaging and laboratory studies on MyChart before your provider has had a chance to review them.  We understand that in some cases there may be results that are confusing or concerning to you. Not all laboratory results come back in the same time frame and the provider may be waiting for multiple results in order to interpret others.  Please give us  48 hours in order for your provider to thoroughly review all the results before contacting the office for clarification of your results.   It was a pleasure to see you today!  Vito Cirigliano, D.O.
